# Patient Record
Sex: Female | Born: 1950
Health system: Southern US, Community
[De-identification: ages and names within clinical notes are randomized; demographics above are authoritative.]

## PROBLEM LIST (undated history)

## (undated) DIAGNOSIS — M797 Fibromyalgia: Secondary | ICD-10-CM

## (undated) DIAGNOSIS — E785 Hyperlipidemia, unspecified: Secondary | ICD-10-CM

## (undated) DIAGNOSIS — C801 Malignant (primary) neoplasm, unspecified: Secondary | ICD-10-CM

## (undated) DIAGNOSIS — T4145XA Adverse effect of unspecified anesthetic, initial encounter: Secondary | ICD-10-CM

## (undated) DIAGNOSIS — T3995XA Adverse effect of unspecified nonopioid analgesic, antipyretic and antirheumatic, initial encounter: Secondary | ICD-10-CM

## (undated) DIAGNOSIS — Z8489 Family history of other specified conditions: Secondary | ICD-10-CM

## (undated) DIAGNOSIS — T8859XA Other complications of anesthesia, initial encounter: Secondary | ICD-10-CM

## (undated) DIAGNOSIS — R112 Nausea with vomiting, unspecified: Secondary | ICD-10-CM

## (undated) DIAGNOSIS — Z9889 Other specified postprocedural states: Secondary | ICD-10-CM

## (undated) DIAGNOSIS — K219 Gastro-esophageal reflux disease without esophagitis: Secondary | ICD-10-CM

## (undated) HISTORY — PX: COLONOSCOPY: SHX174

## (undated) HISTORY — DX: Hyperlipidemia, unspecified: E78.5

---

## 1983-09-21 HISTORY — PX: DILATION AND CURETTAGE OF UTERUS: SHX78

## 1999-10-29 ENCOUNTER — Other Ambulatory Visit: Admission: RE | Admit: 1999-10-29 | Discharge: 1999-10-29 | Payer: Self-pay | Admitting: Gynecology

## 1999-11-05 ENCOUNTER — Other Ambulatory Visit: Admission: RE | Admit: 1999-11-05 | Discharge: 1999-11-05 | Payer: Self-pay | Admitting: General Surgery

## 2001-03-02 ENCOUNTER — Other Ambulatory Visit: Admission: RE | Admit: 2001-03-02 | Discharge: 2001-03-02 | Payer: Self-pay | Admitting: Gynecology

## 2001-03-03 ENCOUNTER — Encounter: Payer: Self-pay | Admitting: Family Medicine

## 2001-03-03 ENCOUNTER — Encounter: Admission: RE | Admit: 2001-03-03 | Discharge: 2001-03-03 | Payer: Self-pay | Admitting: Family Medicine

## 2002-04-24 ENCOUNTER — Encounter: Admission: RE | Admit: 2002-04-24 | Discharge: 2002-04-24 | Payer: Self-pay | Admitting: Family Medicine

## 2002-04-24 ENCOUNTER — Encounter: Payer: Self-pay | Admitting: Family Medicine

## 2003-02-05 ENCOUNTER — Other Ambulatory Visit: Admission: RE | Admit: 2003-02-05 | Discharge: 2003-02-05 | Payer: Self-pay | Admitting: Gynecology

## 2004-08-24 ENCOUNTER — Other Ambulatory Visit: Admission: RE | Admit: 2004-08-24 | Discharge: 2004-08-24 | Payer: Self-pay | Admitting: Gynecology

## 2004-10-09 ENCOUNTER — Ambulatory Visit: Payer: Self-pay | Admitting: Gastroenterology

## 2004-11-05 ENCOUNTER — Ambulatory Visit: Payer: Self-pay | Admitting: Gastroenterology

## 2004-11-16 ENCOUNTER — Ambulatory Visit: Payer: Self-pay | Admitting: Gastroenterology

## 2005-09-07 ENCOUNTER — Other Ambulatory Visit: Admission: RE | Admit: 2005-09-07 | Discharge: 2005-09-07 | Payer: Self-pay | Admitting: Gynecology

## 2009-09-26 ENCOUNTER — Encounter (INDEPENDENT_AMBULATORY_CARE_PROVIDER_SITE_OTHER): Payer: Self-pay | Admitting: *Deleted

## 2010-05-19 ENCOUNTER — Telehealth: Payer: Self-pay | Admitting: Gastroenterology

## 2010-10-08 ENCOUNTER — Other Ambulatory Visit: Payer: Self-pay

## 2010-10-22 NOTE — Letter (Signed)
Summary: Colonoscopy Letter  [facility]  [address] [place], [place] [address]   Phone: [phone]  Fax: [phone]      September 26, 2009 MRN: [id number]   [facility] [address] [place], [place]  [address]   Dear Ms. Suminski,   According to your medical record, it is time for you to schedule a Colonoscopy. The American Cancer Society recommends this procedure as a method to detect early colon cancer. Patients with a family history of colon cancer, or a personal history of colon polyps or inflammatory bowel disease are at increased risk.  This letter has beeen generated based on the recommendations made at the time of your procedure. If you feel that in your particular situation this may no longer apply, please contact our office.  Please call our office at [phone] to schedule this appointment or to update your records at your earliest convenience.  Thank you for cooperating with [place] to provide you with the very best care possible.   Sincerely,  Vania Rea. Jarold Motto, M.D.  [facility] [phone]

## 2010-10-22 NOTE — Progress Notes (Signed)
Summary: Schedule Colonoscopy  Phone Note Outgoing Call Call back at Home Phone [phone]   Call placed by: Harlow Mares CMA [facility]),  May 19, 2010 8:50 AM Call placed to: Patient Summary of Call: called patient to advise her she is due for her colonoscopy she will call back to schedule something for October. Initial call taken by: Harlow Mares CMA [facility]),  May 19, 2010 8:50 AM

## 2010-11-26 ENCOUNTER — Other Ambulatory Visit: Payer: Self-pay | Admitting: Gynecology

## 2011-04-08 ENCOUNTER — Other Ambulatory Visit: Payer: Self-pay | Admitting: Gynecology

## 2012-04-14 ENCOUNTER — Encounter: Payer: Self-pay | Admitting: Gastroenterology

## 2012-05-10 ENCOUNTER — Ambulatory Visit (AMBULATORY_SURGERY_CENTER): Payer: BC Managed Care – PPO | Admitting: *Deleted

## 2012-05-10 ENCOUNTER — Encounter: Payer: Self-pay | Admitting: Gastroenterology

## 2012-05-10 VITALS — Ht 66.0 in | Wt 186.8 lb

## 2012-05-10 DIAGNOSIS — Z1211 Encounter for screening for malignant neoplasm of colon: Secondary | ICD-10-CM

## 2012-05-10 MED ORDER — MOVIPREP 100 G PO SOLR: ORAL | Status: DC

## 2012-06-02 ENCOUNTER — Ambulatory Visit (AMBULATORY_SURGERY_CENTER): Payer: BC Managed Care – PPO | Admitting: Gastroenterology

## 2012-06-02 ENCOUNTER — Encounter: Payer: Self-pay | Admitting: Gastroenterology

## 2012-06-02 VITALS — BP 152/86 | HR 98 | Temp 98.2°F | Resp 22 | Ht 66.0 in | Wt 186.0 lb

## 2012-06-02 DIAGNOSIS — Z1211 Encounter for screening for malignant neoplasm of colon: Secondary | ICD-10-CM

## 2012-06-02 DIAGNOSIS — K5730 Diverticulosis of large intestine without perforation or abscess without bleeding: Secondary | ICD-10-CM

## 2012-06-02 MED ORDER — SODIUM CHLORIDE 0.9 % IV SOLN: 500.0000 mL | INTRAVENOUS | Status: DC

## 2012-06-02 NOTE — Patient Instructions (Addendum)
YOU HAD AN ENDOSCOPIC PROCEDURE TODAY AT THE [facility]: Refer to the procedure report that was given to you for any specific questions about what was found during the examination.  If the procedure report does not answer your questions, please call your gastroenterologist to clarify.  If you requested that your care partner not be given the details of your procedure findings, then the procedure report has been included in a sealed envelope for you to review at your convenience later.  YOU SHOULD EXPECT: Some feelings of bloating in the abdomen. Passage of more gas than usual.  Walking can help get rid of the air that was put into your GI tract during the procedure and reduce the bloating. If you had a lower endoscopy (such as a colonoscopy or flexible sigmoidoscopy) you may notice spotting of blood in your stool or on the toilet paper. If you underwent a bowel prep for your procedure, then you may not have a normal bowel movement for a few days.  DIET: Your first meal following the procedure should be a light meal and then it is ok to progress to your normal diet.  A half-sandwich or bowl of soup is an example of a good first meal.  Heavy or fried foods are harder to digest and may make you feel nauseous or bloated.  Likewise meals heavy in dairy and vegetables can cause extra gas to form and this can also increase the bloating.  Drink plenty of fluids but you should avoid alcoholic beverages for 24 hours.  ACTIVITY: Your care partner should take you home directly after the procedure.  You should plan to take it easy, moving slowly for the rest of the day.  You can resume normal activity the day after the procedure however you should NOT DRIVE or use heavy machinery for 24 hours (because of the sedation medicines used during the test).    SYMPTOMS TO REPORT IMMEDIATELY: A gastroenterologist can be reached at any hour.  During normal business hours, 8:30 AM to 5:00 PM Monday through Friday,  call [phone].  After hours and on weekends, please call the GI answering service at [phone] who will take a message and have the physician on call contact you.   Following lower endoscopy (colonoscopy or flexible sigmoidoscopy):  Excessive amounts of blood in the stool  Significant tenderness or worsening of abdominal pains  Swelling of the abdomen that is new, acute  Fever of 100F or higher FOLLOW UP: Our staff will call the home number listed on your records the next business day following your procedure to check on you and address any questions or concerns that you may have at that time regarding the information given to you following your procedure. This is a courtesy call and so if there is no answer at the home number and we have not heard from you through the emergency physician on call, we will assume that you have returned to your regular daily activities without incident.  SIGNATURES/CONFIDENTIALITY: You and/or your care partner have signed paperwork which will be entered into your electronic medical record.  These signatures attest to the fact that that the information above on your After Visit Summary has been reviewed and is understood.  Full responsibility of the confidentiality of this discharge information lies with you and/or your care-partner.   Ok to resume your normal medications  Follow up in 10 years  Follow a high fiber diet- see handout 

## 2012-06-02 NOTE — Progress Notes (Signed)
Patient did not experience any of the following events: a burn prior to discharge; a fall within the facility; wrong site/side/patient/procedure/implant event; or a hospital transfer or hospital admission upon discharge from the facility. (G8907) Patient did not have preoperative order for IV antibiotic SSI prophylaxis. (G8918)  

## 2012-06-02 NOTE — Op Note (Signed)
 [facility] [address] [place], [address]   COLONOSCOPY PROCEDURE REPORT  PATIENT: Janice Phillips, Janice Phillips.  MR#: [id number] BIRTHDATE: 11-14-1950 , 61  yrs. old GENDER: Female ENDOSCOPIST: Mardella Layman, MD, [facility] REFERRED BY: PROCEDURE DATE:  06/02/2012 PROCEDURE:   Colonoscopy, screening ASA CLASS:   Class II INDICATIONS:average risk patient for colon cancer. MEDICATIONS: propofol (Diprivan) 200mg  IV  DESCRIPTION OF PROCEDURE:   After the risks and benefits and of the procedure were explained, informed consent was obtained.  A digital rectal exam revealed no abnormalities of the rectum.    The LB [id number]  endoscope was introduced through the anus and advanced to the cecum, which was identified by both the appendix and ileocecal valve .  The quality of the prep was excellent, using MoviPrep .  The instrument was then slowly withdrawn as the colon was fully examined.     COLON FINDINGS: Mild diverticulosis was noted in the sigmoid colon. The colon mucosa was otherwise normal.     Retroflexed views revealed no abnormalities.     The scope was then withdrawn from the patient and the procedure completed.  COMPLICATIONS: There were no complications. ENDOSCOPIC IMPRESSION: 1.   Mild diverticulosis was noted in the sigmoid colon 2.   The colon mucosa was otherwise normal  RECOMMENDATIONS: 1.  High fiber diet 2.  Continue current colorectal screening recommendations for "routine risk" patients with a repeat colonoscopy in 10 years.   REPEAT EXAM:  cc:  _______________________________ eSignedMardella Layman, MD, [facility] 06/02/2012 1:50 PM

## 2012-06-05 ENCOUNTER — Telehealth: Payer: Self-pay

## 2012-06-05 NOTE — Telephone Encounter (Signed)
  Follow up Call-  Call back number 06/02/2012  Post procedure Call Back phone  # [phone]  Permission to leave phone message Yes     Patient questions:  Do you have a fever, pain , or abdominal swelling? no Pain Score  0 *  Have you tolerated food without any problems? yes  Have you been able to return to your normal activities? yes  Do you have any questions about your discharge instructions: Diet   no Medications  no Follow up visit  no  Do you have questions or concerns about your Care? no  Actions: * If pain score is 4 or above: No action needed, pain <4.

## 2012-08-31 ENCOUNTER — Other Ambulatory Visit: Payer: Self-pay

## 2013-02-22 ENCOUNTER — Other Ambulatory Visit: Payer: Self-pay | Admitting: Gynecology

## 2013-09-20 HISTORY — PX: KNEE ARTHROSCOPY: SUR90

## 2014-02-28 ENCOUNTER — Other Ambulatory Visit: Payer: Self-pay | Admitting: Gynecology

## 2014-03-01 LAB — CYTOLOGY - PAP

## 2015-10-30 ENCOUNTER — Other Ambulatory Visit: Payer: Self-pay | Admitting: Obstetrics and Gynecology

## 2015-10-31 LAB — CYTOLOGY - PAP

## 2016-01-13 ENCOUNTER — Other Ambulatory Visit: Payer: Self-pay | Admitting: Obstetrics and Gynecology

## 2016-01-13 DIAGNOSIS — N8500 Endometrial hyperplasia, unspecified: Secondary | ICD-10-CM | POA: Diagnosis not present

## 2016-01-13 DIAGNOSIS — C541 Malignant neoplasm of endometrium: Secondary | ICD-10-CM | POA: Diagnosis not present

## 2016-01-13 DIAGNOSIS — N950 Postmenopausal bleeding: Secondary | ICD-10-CM | POA: Diagnosis not present

## 2016-02-02 ENCOUNTER — Ambulatory Visit: Payer: BLUE CROSS/BLUE SHIELD | Attending: Gynecologic Oncology | Admitting: Gynecologic Oncology

## 2016-02-02 ENCOUNTER — Encounter: Payer: Self-pay | Admitting: Gynecologic Oncology

## 2016-02-02 VITALS — BP 160/83 | HR 102 | Temp 98.3°F | Resp 18 | Ht 66.0 in | Wt 181.3 lb

## 2016-02-02 DIAGNOSIS — E785 Hyperlipidemia, unspecified: Secondary | ICD-10-CM | POA: Diagnosis not present

## 2016-02-02 DIAGNOSIS — C541 Malignant neoplasm of endometrium: Secondary | ICD-10-CM | POA: Diagnosis not present

## 2016-02-02 DIAGNOSIS — Z885 Allergy status to narcotic agent status: Secondary | ICD-10-CM | POA: Insufficient documentation

## 2016-02-02 DIAGNOSIS — R112 Nausea with vomiting, unspecified: Secondary | ICD-10-CM | POA: Insufficient documentation

## 2016-02-02 DIAGNOSIS — Z79899 Other long term (current) drug therapy: Secondary | ICD-10-CM | POA: Insufficient documentation

## 2016-02-02 NOTE — Patient Instructions (Signed)
Preparing for your Surgery  Plan for surgery on May 30 with Dr. Everitt Amber at [facility] will be scheduled for a robotic assisted total hysterectomy, bilateral salpingo-oophorectomy, sentinel lymph node biopsy.  Risks of surgery include bleeding, damage to surrounding structures, re-operation, infection.  Pre-operative Testing -You will receive a phone call from presurgical testing at [facility] to arrange for a pre-operative testing appointment before your surgery.  This appointment normally occurs one to two weeks before your scheduled surgery.   -Bring your insurance card, copy of an advanced directive if applicable, medication list  -At that visit, you will be asked to sign a consent for a possible blood transfusion in case a transfusion becomes necessary during surgery.  The need for a blood transfusion is rare but having consent is a necessary part of your care.     -You should not be taking blood thinners or aspirin at least ten days prior to surgery unless instructed by your surgeon.  Day Before Surgery at [place] will be asked to take in a light diet the day before surgery.  Avoid carbonated beverages.  You will be advised to have nothing to eat or drink after midnight the evening before.     Eat a light diet the day before surgery.  Examples including soups, broths, toast, yogurt, mashed potatoes.  Things to avoid include carbonated beverages (fizzy beverages), raw fruits and raw vegetables, or beans.    If your bowels are filled with gas, your surgeon will have difficulty visualizing your pelvic organs which increases your surgical risks.  Your role in recovery Your role is to become active as soon as directed by your doctor, while still giving yourself time to heal.  Rest when you feel tired. You will be asked to do the following in order to speed your recovery:  - Cough and breathe deeply. This helps toclear and expand your lungs and can  prevent pneumonia. You may be given a spirometer to practice deep breathing. A staff member will show you how to use the spirometer. - Do mild physical activity. Walking or moving your legs help your circulation and body functions return to normal. A staff member will help you when you try to walk and will provide you with simple exercises. Do not try to get up or walk alone the first time. - Actively manage your pain. Managing your pain lets you move in comfort. We will ask you to rate your pain on a scale of zero to 10. It is your responsibility to tell your doctor or nurse where and how much you hurt so your pain can be treated.  Special Considerations -If you are diabetic, you may be placed on insulin after surgery to have closer control over your blood sugars to promote healing and recovery.  This does not mean that you will be discharged on insulin.  If applicable, your oral antidiabetics will be resumed when you are tolerating a solid diet.  -Your final pathology results from surgery should be available by the Friday after surgery and the results will be relayed to you when available.  Blood Transfusion Information WHAT IS A BLOOD TRANSFUSION? A transfusion is the replacement of blood or some of its parts. Blood is made up of multiple cells which provide different functions.  Red blood cells carry oxygen and are used for blood loss replacement.  White blood cells fight against infection.  Platelets control bleeding.  Plasma helps clot blood.  Other  blood products are available for specialized needs, such as hemophilia or other clotting disorders. BEFORE THE TRANSFUSION  Who gives blood for transfusions?   You may be able to donate blood to be used at a later date on yourself (autologous donation).  Relatives can be asked to donate blood. This is generally not any safer than if you have received blood from a stranger. The same precautions are taken to ensure safety when a relative's  blood is donated.  Healthy volunteers who are fully evaluated to make sure their blood is safe. This is blood bank blood. Transfusion therapy is the safest it has ever been in the practice of medicine. Before blood is taken from a donor, a complete history is taken to make sure that person has no history of diseases nor engages in risky social behavior (examples are intravenous drug use or sexual activity with multiple partners). The donor's travel history is screened to minimize risk of transmitting infections, such as malaria. The donated blood is tested for signs of infectious diseases, such as HIV and hepatitis. The blood is then tested to be sure it is compatible with you in order to minimize the chance of a transfusion reaction. If you or a relative donates blood, this is often done in anticipation of surgery and is not appropriate for emergency situations. It takes many days to process the donated blood. RISKS AND COMPLICATIONS Although transfusion therapy is very safe and saves many lives, the main dangers of transfusion include:   Getting an infectious disease.  Developing a transfusion reaction. This is an allergic reaction to something in the blood you were given. Every precaution is taken to prevent this. The decision to have a blood transfusion has been considered carefully by your caregiver before blood is given. Blood is not given unless the benefits outweigh the risks.

## 2016-02-03 ENCOUNTER — Encounter: Payer: Self-pay | Admitting: Gynecologic Oncology

## 2016-02-03 DIAGNOSIS — C541 Malignant neoplasm of endometrium: Secondary | ICD-10-CM | POA: Insufficient documentation

## 2016-02-03 NOTE — Progress Notes (Signed)
Consult Note: Gyn-Onc  Consult was requested by Dr. Philis Pique for the evaluation of Janice Phillips 65 y.o. female  CC:  Chief Complaint  Patient presents with  . New Consultation    endometrial cancer    Assessment/Plan:  Janice Phillips  is a 65 y.o.  year old with grade 1 endometrial adenocarcinoma on D&C.   A detailed discussion was held with the patient and her family with regard to to her endometrial cancer diagnosis. We discussed the standard management options for uterine cancer which includes surgery followed possibly by adjuvant therapy depending on the results of surgery. The options for surgical management include a hysterectomy and removal of the tubes and ovaries possibly with removal of pelvic and para-aortic lymph nodes. A minimally invasive approach including a robotic hysterectomy or laparoscopic hysterectomy have benefits including shorter hospital stay, recovery time and better wound healing. The alternative approach is an open hysterectomy. The patient has been counseled about these surgical options and the risks of surgery in general including infection, bleeding, damage to surrounding structures (including bowel, bladder, ureters, nerves or vessels), and the postoperative risks of PE/ DVT, and lymphedema. I extensively reviewed the additional risks of robotic hysterectomy including possible need for conversion to open laparotomy.  I discussed positioning during surgery of trendelenberg and risks of minor facial swelling and care we take in preoperative positioning.  After counseling and consideration of her options, she desires to proceed with robotic assisted total laparoscopic hysterectomy, BSO, sentinel lymph node biopsy.   She will be seen by anesthesia for preoperative clearance and discussion of postoperative pain management.  She was given the opportunity to ask questions, which were answered to her satisfaction, and she is agreement with the above mentioned plan of  care. She experiences severe post op nausea and vomiting and we will discuss this further with anesthesia at the time of her surgery to prophylax.   HPI: Janice Phillips is a 65 year old parous woman who is seen in consultation at the request of Dr Philis Pique for grade 1 endometrial cancer. She has been having intermittent postmenopausal bleeding for approximately 5 years with several office biopsies that had always been benign. Most recently seh reported this symptom to Dr Philis Pique, who, on 01/13/16 took her to the operating room for a hysteroscopy and D&C. Intraoperative findings included uterine polyps. Final pathology revealed adenocarcinoma FIGO grade 1.   The patient is otherwise very healthy and has had no abdominal surgeries. She has severe postoperative nausea and vomiting and is very "sensitive" to anesthesia.  She is easily oversedated.  Her BMI is overweight at 29kg/m2.   Current Meds:  Outpatient Encounter Prescriptions as of 02/02/2016  Medication Sig  . calcium gluconate 500 MG tablet Take 500 mg by mouth daily.  . cholecalciferol (VITAMIN D) 1000 UNITS tablet Take 1,000 Units by mouth daily.  . Cyanocobalamin (VITAMIN B-12 PO) Take 1 tablet by mouth daily.   . meclizine (ANTIVERT) 25 MG tablet Take 25 mg by mouth as needed (For vertigo.).   . [DISCONTINUED] CRESTOR 10 MG tablet Take 5 mg by mouth daily. Takes 2.5mg  daily  . [DISCONTINUED] Misc Natural Products (SUPER GREENS) POWD Take by mouth. Takes 2 scoops daily   No facility-administered encounter medications on file as of 02/02/2016.    Allergy:  Allergies  Allergen Reactions  . Vicodin [Hydrocodone-Acetaminophen] Nausea And Vomiting    Social Hx:   Social History   Social History  . Marital Status: Married  Spouse Name: N/A  . Number of Children: N/A  . Years of Education: N/A   Occupational History  . Not on file.   Social History Main Topics  . Smoking status: Never Smoker   . Smokeless tobacco: Never Used  .  Alcohol Use: No  . Drug Use: No  . Sexual Activity: Not on file   Other Topics Concern  . Not on file   Social History Narrative    Past Surgical Hx:  Past Surgical History  Procedure Laterality Date  . Dilation and curettage of uterus  1985    Past Medical Hx:  Past Medical History  Diagnosis Date  . Hyperlipidemia     Past Gynecological History:  SVD x 2  No LMP recorded. Patient is postmenopausal.  Family Hx:  Family History  Problem Relation Age of Onset  . Breast cancer Sister   . Colon cancer Maternal Grandfather 97  . Stomach cancer Neg Hx     Review of Systems:  Constitutional  Feels well,    ENT Normal appearing ears and nares bilaterally Skin/Breast  No rash, sores, jaundice, itching, dryness Cardiovascular  No chest pain, shortness of breath, or edema  Pulmonary  No cough or wheeze.  Gastro Intestinal  No nausea, vomitting, or diarrhoea. No bright red blood per rectum, no abdominal pain, change in bowel movement, or constipation.  Genito Urinary  No frequency, urgency, dysuria, + PMB Musculo Skeletal  No myalgia, arthralgia, joint swelling or pain  Neurologic  No weakness, numbness, change in gait,  Psychology  No depression, anxiety, insomnia.   Vitals:  Blood pressure 160/83, pulse 102, temperature 98.3 F (36.8 C), temperature source Oral, resp. rate 18, height 5\' 6"  (1.676 m), weight 181 lb 4.8 oz (82.237 kg), SpO2 99 %.  Physical Exam: WD in NAD Neck  Supple NROM, without any enlargements.  Lymph Node Survey No cervical supraclavicular or inguinal adenopathy Cardiovascular  Pulse normal rate, regularity and rhythm. S1 and S2 normal.  Lungs  Clear to auscultation bilateraly, without wheezes/crackles/rhonchi. Good air movement.  Skin  No rash/lesions/breakdown  Psychiatry  Alert and oriented to person, place, and time  Abdomen  Normoactive bowel sounds, abdomen soft, non-tender and slightly overweight without evidence of hernia.   Back No CVA tenderness Genito Urinary  Vulva/vagina: Normal external female genitalia.  No lesions. No discharge or bleeding.  Bladder/urethra:  No lesions or masses, well supported bladder  Vagina: normal  Cervix: Normal appearing, no lesions.  Uterus:  Birenbaum, mobile, no parametrial involvement or nodularity.  Adnexa: no palpable masses. Rectal  Good tone, no masses no cul de sac nodularity.  Extremities  No bilateral cyanosis, clubbing or edema.   Donaciano Eva, MD  02/03/2016, 4:41 PM

## 2016-02-05 NOTE — Progress Notes (Signed)
Please place surgical orders in epic for surgical procedure scheduled for 02/17/2016. Pt has PAT appt scheduled for 02/12/2016. Thanks.

## 2016-02-12 ENCOUNTER — Ambulatory Visit (HOSPITAL_COMMUNITY)
Admission: RE | Admit: 2016-02-12 | Discharge: 2016-02-12 | Disposition: A | Discharge status: Home / Self Care | Payer: BLUE CROSS/BLUE SHIELD | Source: Ambulatory Visit | Attending: Gynecologic Oncology | Admitting: Gynecologic Oncology

## 2016-02-12 ENCOUNTER — Encounter (HOSPITAL_COMMUNITY)
Admission: RE | Admit: 2016-02-12 | Discharge: 2016-02-12 | Disposition: A | Discharge status: Home / Self Care | Payer: BLUE CROSS/BLUE SHIELD | Source: Ambulatory Visit | Attending: Gynecologic Oncology | Admitting: Gynecologic Oncology

## 2016-02-12 ENCOUNTER — Encounter (HOSPITAL_COMMUNITY): Payer: Self-pay

## 2016-02-12 DIAGNOSIS — Z01818 Encounter for other preprocedural examination: Secondary | ICD-10-CM | POA: Insufficient documentation

## 2016-02-12 DIAGNOSIS — C541 Malignant neoplasm of endometrium: Secondary | ICD-10-CM | POA: Insufficient documentation

## 2016-02-12 HISTORY — DX: Adverse effect of unspecified nonopioid analgesic, antipyretic and antirheumatic, initial encounter: T39.95XA

## 2016-02-12 HISTORY — DX: Family history of other specified conditions: Z84.89

## 2016-02-12 HISTORY — DX: Adverse effect of unspecified anesthetic, initial encounter: T41.45XA

## 2016-02-12 HISTORY — DX: Other specified postprocedural states: Z98.890

## 2016-02-12 HISTORY — DX: Malignant (primary) neoplasm, unspecified: C80.1

## 2016-02-12 HISTORY — DX: Fibromyalgia: M79.7

## 2016-02-12 HISTORY — DX: Nausea with vomiting, unspecified: R11.2

## 2016-02-12 HISTORY — DX: Other complications of anesthesia, initial encounter: T88.59XA

## 2016-02-12 HISTORY — DX: Gastro-esophageal reflux disease without esophagitis: K21.9

## 2016-02-12 LAB — CBC WITH DIFFERENTIAL/PLATELET
Basophils Absolute: 0.1 10*3/uL (ref 0.0–0.1)
Basophils Relative: 1 %
EOS ABS: 0.2 10*3/uL (ref 0.0–0.7)
Eosinophils Relative: 2 %
HCT: 42.8 % (ref 36.0–46.0)
HEMOGLOBIN: 15.2 g/dL — AB (ref 12.0–15.0)
LYMPHS ABS: 2.1 10*3/uL (ref 0.7–4.0)
LYMPHS PCT: 24 %
MCH: 31.6 pg (ref 26.0–34.0)
MCHC: 35.5 g/dL (ref 30.0–36.0)
MCV: 89 fL (ref 78.0–100.0)
Monocytes Absolute: 0.8 10*3/uL (ref 0.1–1.0)
Monocytes Relative: 9 %
NEUTROS PCT: 64 %
Neutro Abs: 5.5 10*3/uL (ref 1.7–7.7)
Platelets: 251 10*3/uL (ref 150–400)
RBC: 4.81 MIL/uL (ref 3.87–5.11)
RDW: 12.8 % (ref 11.5–15.5)
WBC: 8.5 10*3/uL (ref 4.0–10.5)

## 2016-02-12 LAB — URINALYSIS, ROUTINE W REFLEX MICROSCOPIC
Bilirubin Urine: NEGATIVE
Glucose, UA: NEGATIVE mg/dL
Hgb urine dipstick: NEGATIVE
Ketones, ur: NEGATIVE mg/dL
NITRITE: NEGATIVE
PH: 7 (ref 5.0–8.0)
Protein, ur: NEGATIVE mg/dL
SPECIFIC GRAVITY, URINE: 1.005 (ref 1.005–1.030)

## 2016-02-12 LAB — COMPREHENSIVE METABOLIC PANEL
ALK PHOS: 66 U/L (ref 38–126)
ALT: 26 U/L (ref 14–54)
AST: 27 U/L (ref 15–41)
Albumin: 4.6 g/dL (ref 3.5–5.0)
Anion gap: 8 (ref 5–15)
BUN: 13 mg/dL (ref 6–20)
CALCIUM: 10.1 mg/dL (ref 8.9–10.3)
CO2: 27 mmol/L (ref 22–32)
CREATININE: 1 mg/dL (ref 0.44–1.00)
Chloride: 105 mmol/L (ref 101–111)
GFR calc non Af Amer: 59 mL/min — ABNORMAL LOW (ref >60)
GLUCOSE: 94 mg/dL (ref 65–99)
Potassium: 3.8 mmol/L (ref 3.5–5.1)
SODIUM: 140 mmol/L (ref 135–145)
Total Bilirubin: 0.6 mg/dL (ref 0.3–1.2)
Total Protein: 7.6 g/dL (ref 6.5–8.1)

## 2016-02-12 LAB — URINE MICROSCOPIC-ADD ON: RBC / HPF: NONE SEEN RBC/hpf (ref 0–5)

## 2016-02-12 LAB — ABO/RH: ABO/RH(D): O POS

## 2016-02-12 NOTE — Patient Instructions (Signed)
Janice Phillips  02/12/2016   Your procedure is scheduled on: 02/17/16  Report to [facility] Main  Entrance take [facility]  elevators to 3rd floor to  [place] at 7:45 AM.  Call this number if you have problems the morning of surgery [phone]   Remember: ONLY 1 PERSON MAY GO WITH YOU TO SHORT STAY TO GET  READY MORNING OF [place].  Do not eat food or drink liquids :After Midnight Monday.     Take these medicines the morning of surgery with A SIP OF WATER:  none                                You may not have any metal on your body including hair pins and              piercings  Do not wear jewelry, make-up, lotions, powders or perfumes, deodorant             Do not wear nail polish.  Do not shave  48 hours prior to surgery.                 Do not bring valuables to the hospital. [place].  Contacts, dentures or bridgework may not be worn into surgery.  Leave suitcase in the car. After surgery it may be brought to your room.  _____________________________________________________________________             [facility] - Preparing for Surgery  Before surgery, you can play an important role.  Because skin is not sterile, your skin needs to be as free of germs as possible.  You can reduce the number of germs on you skin by washing with CHG (chlorahexidine gluconate) soap before surgery.  CHG is an antiseptic cleaner which kills germs and bonds with the skin to continue killing germs even after washing.  Please DO NOT use if you have an allergy to CHG or antibacterial soaps.  If your skin becomes reddened/irritated stop using the CHG and inform your nurse when you arrive at Short Stay.  Do not shave (including legs and underarms) for at least 48 hours prior to the first CHG shower.  You may shave your face.  Please follow these instructions carefully:   1.  Shower with CHG Soap the night before  surgery and the                                morning of Surgery.  2.  If you choose to wash your hair, wash your hair first as usual with your       normal shampoo.  3.  After you shampoo, rinse your hair and body thoroughly to remove the                      Shampoo.  4.  Use CHG as you would any other liquid soap.  You can apply chg directly       to the skin and wash gently with scrungie or a clean washcloth.  5.  Apply the CHG Soap to your body ONLY FROM THE NECK DOWN.  Do not use on open wounds or open sores.  Avoid contact with your eyes,       ears, mouth and genitals (private parts).  Wash genitals (private parts)       with your normal soap.  Incentive Spirometer  An incentive spirometer is a tool that can help keep your lungs clear and active. This tool measures how well you are filling your lungs with each breath. Taking long deep breaths may help reverse or decrease the chance of developing breathing (pulmonary) problems (especially infection) following:  A long period of time when you are unable to move or be active. BEFORE THE PROCEDURE   If the spirometer includes an indicator to show your best effort, your nurse or respiratory therapist will set it to a desired goal.  If possible, sit up straight or lean slightly forward. Try not to slouch.  Hold the incentive spirometer in an upright position. INSTRUCTIONS FOR USE  1. Sit on the edge of your bed if possible, or sit up as far as you can in bed or on a chair. 2. Hold the incentive spirometer in an upright position. 3. Breathe out normally. 4. Place the mouthpiece in your mouth and seal your lips tightly around it. 5. Breathe in slowly and as deeply as possible, raising the piston or the ball toward the top of the column. 6. Hold your breath for 3-5 seconds or for as long as possible. Allow the piston or ball to fall to the bottom of the column. 7. Remove the mouthpiece from your mouth and breathe out normally. 8. Rest  for a few seconds and repeat Steps 1 through 7 at least 10 times every 1-2 hours when you are awake. Take your time and take a few normal breaths between deep breaths. 9. The spirometer may include an indicator to show your best effort. Use the indicator as a goal to work toward during each repetition. 10. After each set of 10 deep breaths, practice coughing to be sure your lungs are clear. If you have an incision (the cut made at the time of surgery), support your incision when coughing by placing a pillow or rolled up towels firmly against it. Once you are able to get out of bed, walk around indoors and cough well. You may stop using the incentive spirometer when instructed by your caregiver.  RISKS AND COMPLICATIONS  Take your time so you do not get dizzy or light-headed.  If you are in pain, you may need to take or ask for pain medication before doing incentive spirometry. It is harder to take a deep breath if you are having pain. AFTER USE  Rest and breathe slowly and easily.  It can be helpful to keep track of a log of your progress. Your caregiver can provide you with a simple table to help with this. If you are using the spirometer at home, follow these instructions: [place] IF:   You are having difficultly using the spirometer.  You have trouble using the spirometer as often as instructed.  Your pain medication is not giving enough relief while using the spirometer.  You develop fever of 100.5 F (38.1 C) or higher. SEEK IMMEDIATE MEDICAL CARE IF:   You cough up bloody sputum that had not been present before.  You develop fever of 102 F (38.9 C) or greater.  You develop worsening pain at or near the incision site. MAKE SURE YOU:   Understand these instructions.  Will watch  your condition.  Will get help right away if you are not doing well or get worse. Document Released: 01/17/2007 Document Revised: 11/29/2011 Document Reviewed: 03/20/2007 [facility]  Patient Information 2014 [employer], [place].   ________________________________________________________________________   6.  Wash thoroughly, paying special attention to the area where your surgery        will be performed.  7.  Thoroughly rinse your body with warm water from the neck down.  8.  DO NOT shower/wash with your normal soap after using and rinsing off       the CHG Soap.  9.  Pat yourself dry with a clean towel.            10.  Wear clean pajamas.            11.  Place clean sheets on your bed the night of your first shower and do not        sleep with pets.  Day of Surgery  Do not apply any lotions/deoderants the morning of surgery.  Please wear clean clothes to the hospital/surgery center.

## 2016-02-17 ENCOUNTER — Encounter (HOSPITAL_COMMUNITY)
Admission: RE | Disposition: A | Discharge status: Home / Self Care | Payer: Self-pay | Source: Ambulatory Visit | Attending: Gynecologic Oncology

## 2016-02-17 ENCOUNTER — Ambulatory Visit (HOSPITAL_COMMUNITY): Payer: BLUE CROSS/BLUE SHIELD | Admitting: Anesthesiology

## 2016-02-17 ENCOUNTER — Ambulatory Visit (HOSPITAL_COMMUNITY)
Admission: RE | Admit: 2016-02-17 | Discharge: 2016-02-18 | Disposition: A | Discharge status: Home / Self Care | Payer: BLUE CROSS/BLUE SHIELD | Source: Ambulatory Visit | Attending: Gynecologic Oncology | Admitting: Gynecologic Oncology

## 2016-02-17 ENCOUNTER — Encounter (HOSPITAL_COMMUNITY): Payer: Self-pay | Admitting: *Deleted

## 2016-02-17 DIAGNOSIS — E663 Overweight: Secondary | ICD-10-CM | POA: Diagnosis not present

## 2016-02-17 DIAGNOSIS — Z800 Family history of malignant neoplasm of digestive organs: Secondary | ICD-10-CM | POA: Insufficient documentation

## 2016-02-17 DIAGNOSIS — K219 Gastro-esophageal reflux disease without esophagitis: Secondary | ICD-10-CM | POA: Diagnosis not present

## 2016-02-17 DIAGNOSIS — Z885 Allergy status to narcotic agent status: Secondary | ICD-10-CM | POA: Diagnosis not present

## 2016-02-17 DIAGNOSIS — Z6829 Body mass index (BMI) 29.0-29.9, adult: Secondary | ICD-10-CM | POA: Diagnosis not present

## 2016-02-17 DIAGNOSIS — C541 Malignant neoplasm of endometrium: Secondary | ICD-10-CM | POA: Diagnosis present

## 2016-02-17 DIAGNOSIS — E785 Hyperlipidemia, unspecified: Secondary | ICD-10-CM | POA: Insufficient documentation

## 2016-02-17 DIAGNOSIS — D251 Intramural leiomyoma of uterus: Secondary | ICD-10-CM | POA: Diagnosis not present

## 2016-02-17 DIAGNOSIS — M797 Fibromyalgia: Secondary | ICD-10-CM | POA: Insufficient documentation

## 2016-02-17 DIAGNOSIS — Z803 Family history of malignant neoplasm of breast: Secondary | ICD-10-CM | POA: Diagnosis not present

## 2016-02-17 HISTORY — PX: ROBOTIC ASSISTED TOTAL HYSTERECTOMY WITH BILATERAL SALPINGO OOPHERECTOMY: SHX6086

## 2016-02-17 LAB — TYPE AND SCREEN
ABO/RH(D): O POS
ANTIBODY SCREEN: NEGATIVE

## 2016-02-17 SURGERY — HYSTERECTOMY, TOTAL, ROBOT-ASSISTED, LAPAROSCOPIC, WITH BILATERAL SALPINGO-OOPHORECTOMY
Anesthesia: General | Laterality: Bilateral

## 2016-02-17 MED ORDER — IBUPROFEN 800 MG PO TABS: 800.0000 mg | ORAL_TABLET | Freq: Three times a day (TID) | ORAL | Status: DC | PRN

## 2016-02-17 MED ORDER — PROPOFOL 10 MG/ML IV BOLUS
INTRAVENOUS | Status: DC | PRN
  Administered 2016-02-17: 200 mg via INTRAVENOUS

## 2016-02-17 MED ORDER — ONDANSETRON HCL 4 MG/2ML IJ SOLN
INTRAMUSCULAR | Status: DC | PRN
  Administered 2016-02-17: 4 mg via INTRAVENOUS

## 2016-02-17 MED ORDER — ACETAMINOPHEN 10 MG/ML IV SOLN
INTRAVENOUS | Status: AC
  Filled 2016-02-17: qty 100

## 2016-02-17 MED ORDER — KETOROLAC TROMETHAMINE 15 MG/ML IJ SOLN
15.0000 mg | Freq: Four times a day (QID) | INTRAMUSCULAR | Status: DC
  Administered 2016-02-17 – 2016-02-18 (×3): 15 mg via INTRAVENOUS
  Filled 2016-02-17 (×4): qty 1

## 2016-02-17 MED ORDER — ONDANSETRON HCL 4 MG/2ML IJ SOLN
4.0000 mg | Freq: Four times a day (QID) | INTRAMUSCULAR | Status: DC | PRN
  Administered 2016-02-17: 4 mg via INTRAVENOUS
  Filled 2016-02-17: qty 2

## 2016-02-17 MED ORDER — DEXAMETHASONE SODIUM PHOSPHATE 10 MG/ML IJ SOLN
INTRAMUSCULAR | Status: AC
  Filled 2016-02-17: qty 1

## 2016-02-17 MED ORDER — SUCCINYLCHOLINE CHLORIDE 20 MG/ML IJ SOLN
INTRAMUSCULAR | Status: DC | PRN
  Administered 2016-02-17: 100 mg via INTRAVENOUS

## 2016-02-17 MED ORDER — CEFAZOLIN SODIUM-DEXTROSE 2-4 GM/100ML-% IV SOLN
2.0000 g | INTRAVENOUS | Status: AC
  Administered 2016-02-17: 2 g via INTRAVENOUS
  Filled 2016-02-17: qty 100

## 2016-02-17 MED ORDER — EPHEDRINE SULFATE 50 MG/ML IJ SOLN
INTRAMUSCULAR | Status: DC | PRN
  Administered 2016-02-17: 5 mg via INTRAVENOUS

## 2016-02-17 MED ORDER — CETYLPYRIDINIUM CHLORIDE 0.05 % MT LIQD
7.0000 mL | Freq: Two times a day (BID) | OROMUCOSAL | Status: DC
  Administered 2016-02-17 – 2016-02-18 (×2): 7 mL via OROMUCOSAL

## 2016-02-17 MED ORDER — KETOROLAC TROMETHAMINE 30 MG/ML IJ SOLN
INTRAMUSCULAR | Status: DC | PRN
  Administered 2016-02-17: 30 mg via INTRAVENOUS

## 2016-02-17 MED ORDER — SUGAMMADEX SODIUM 200 MG/2ML IV SOLN
INTRAVENOUS | Status: AC
  Filled 2016-02-17: qty 2

## 2016-02-17 MED ORDER — KCL IN DEXTROSE-NACL 20-5-0.45 MEQ/L-%-% IV SOLN
INTRAVENOUS | Status: DC
  Administered 2016-02-17: 15:00:00 via INTRAVENOUS
  Administered 2016-02-18: 1000 mL via INTRAVENOUS
  Filled 2016-02-17 (×3): qty 1000

## 2016-02-17 MED ORDER — MIDAZOLAM HCL 2 MG/2ML IJ SOLN
INTRAMUSCULAR | Status: AC
  Filled 2016-02-17: qty 2

## 2016-02-17 MED ORDER — ACETAMINOPHEN 10 MG/ML IV SOLN
1000.0000 mg | Freq: Once | INTRAVENOUS | Status: AC
  Administered 2016-02-17: 1000 mg via INTRAVENOUS

## 2016-02-17 MED ORDER — PROCHLORPERAZINE EDISYLATE 5 MG/ML IJ SOLN
INTRAMUSCULAR | Status: AC
  Filled 2016-02-17: qty 2

## 2016-02-17 MED ORDER — PROCHLORPERAZINE EDISYLATE 5 MG/ML IJ SOLN
10.0000 mg | Freq: Once | INTRAMUSCULAR | Status: AC | PRN
  Administered 2016-02-17: 10 mg via INTRAVENOUS

## 2016-02-17 MED ORDER — BUPIVACAINE HCL (PF) 0.25 % IJ SOLN
INTRAMUSCULAR | Status: DC | PRN
  Administered 2016-02-17: 20 mL

## 2016-02-17 MED ORDER — KETOROLAC TROMETHAMINE 30 MG/ML IJ SOLN
INTRAMUSCULAR | Status: AC
  Filled 2016-02-17: qty 1

## 2016-02-17 MED ORDER — ROCURONIUM BROMIDE 100 MG/10ML IV SOLN
INTRAVENOUS | Status: AC
  Filled 2016-02-17: qty 1

## 2016-02-17 MED ORDER — ENOXAPARIN SODIUM 40 MG/0.4ML ~~LOC~~ SOLN
40.0000 mg | SUBCUTANEOUS | Status: AC
  Administered 2016-02-17: 40 mg via SUBCUTANEOUS
  Filled 2016-02-17: qty 0.4

## 2016-02-17 MED ORDER — FENTANYL CITRATE (PF) 250 MCG/5ML IJ SOLN
INTRAMUSCULAR | Status: AC
  Filled 2016-02-17: qty 5

## 2016-02-17 MED ORDER — ONDANSETRON HCL 4 MG/2ML IJ SOLN
INTRAMUSCULAR | Status: AC
  Filled 2016-02-17: qty 2

## 2016-02-17 MED ORDER — ACETAMINOPHEN 500 MG PO TABS
1000.0000 mg | ORAL_TABLET | Freq: Four times a day (QID) | ORAL | Status: DC
  Administered 2016-02-17 – 2016-02-18 (×3): 1000 mg via ORAL
  Filled 2016-02-17 (×7): qty 2

## 2016-02-17 MED ORDER — STERILE WATER FOR IRRIGATION IR SOLN
Status: DC | PRN
  Administered 2016-02-17: 1000 mL

## 2016-02-17 MED ORDER — ONDANSETRON HCL 4 MG PO TABS: 4.0000 mg | ORAL_TABLET | Freq: Four times a day (QID) | ORAL | Status: DC | PRN

## 2016-02-17 MED ORDER — STERILE WATER FOR INJECTION IJ SOLN
INTRAMUSCULAR | Status: AC
  Filled 2016-02-17: qty 10

## 2016-02-17 MED ORDER — HYDROMORPHONE HCL 1 MG/ML IJ SOLN
INTRAMUSCULAR | Status: AC
  Filled 2016-02-17: qty 1

## 2016-02-17 MED ORDER — HYDROMORPHONE HCL 1 MG/ML IJ SOLN
0.2500 mg | INTRAMUSCULAR | Status: DC | PRN
  Administered 2016-02-17 (×2): 0.5 mg via INTRAVENOUS

## 2016-02-17 MED ORDER — ENOXAPARIN SODIUM 40 MG/0.4ML ~~LOC~~ SOLN
40.0000 mg | SUBCUTANEOUS | Status: DC
  Administered 2016-02-18: 40 mg via SUBCUTANEOUS
  Filled 2016-02-17 (×2): qty 0.4

## 2016-02-17 MED ORDER — KETOROLAC TROMETHAMINE 15 MG/ML IJ SOLN: 15.0000 mg | Freq: Four times a day (QID) | INTRAMUSCULAR | Status: DC

## 2016-02-17 MED ORDER — ROCURONIUM BROMIDE 100 MG/10ML IV SOLN
INTRAVENOUS | Status: DC | PRN
  Administered 2016-02-17 (×2): 10 mg via INTRAVENOUS
  Administered 2016-02-17: 40 mg via INTRAVENOUS

## 2016-02-17 MED ORDER — PROPOFOL 10 MG/ML IV BOLUS
INTRAVENOUS | Status: AC
  Filled 2016-02-17: qty 20

## 2016-02-17 MED ORDER — BUPIVACAINE HCL (PF) 0.25 % IJ SOLN
INTRAMUSCULAR | Status: AC
  Filled 2016-02-17: qty 30

## 2016-02-17 MED ORDER — LACTATED RINGERS IV SOLN
INTRAVENOUS | Status: DC | PRN
  Administered 2016-02-17: 1000 mL

## 2016-02-17 MED ORDER — FENTANYL CITRATE (PF) 100 MCG/2ML IJ SOLN
INTRAMUSCULAR | Status: DC | PRN
  Administered 2016-02-17: 100 ug via INTRAVENOUS

## 2016-02-17 MED ORDER — LIDOCAINE HCL (CARDIAC) 20 MG/ML IV SOLN
INTRAVENOUS | Status: AC
  Filled 2016-02-17: qty 5

## 2016-02-17 MED ORDER — TRAMADOL HCL 50 MG PO TABS
100.0000 mg | ORAL_TABLET | Freq: Four times a day (QID) | ORAL | Status: DC | PRN
  Administered 2016-02-17: 100 mg via ORAL
  Filled 2016-02-17: qty 2

## 2016-02-17 MED ORDER — DEXAMETHASONE SODIUM PHOSPHATE 10 MG/ML IJ SOLN
INTRAMUSCULAR | Status: DC | PRN
  Administered 2016-02-17: 10 mg via INTRAVENOUS

## 2016-02-17 MED ORDER — SUGAMMADEX SODIUM 200 MG/2ML IV SOLN
INTRAVENOUS | Status: DC | PRN
  Administered 2016-02-17: 200 mg via INTRAVENOUS

## 2016-02-17 MED ORDER — LABETALOL HCL 5 MG/ML IV SOLN
INTRAVENOUS | Status: AC
  Filled 2016-02-17: qty 4

## 2016-02-17 MED ORDER — LACTATED RINGERS IV SOLN
INTRAVENOUS | Status: DC
  Administered 2016-02-17 (×2): via INTRAVENOUS

## 2016-02-17 MED ORDER — CEFAZOLIN SODIUM-DEXTROSE 2-4 GM/100ML-% IV SOLN
INTRAVENOUS | Status: AC
  Filled 2016-02-17: qty 100

## 2016-02-17 MED ORDER — HEMOSTATIC AGENTS (NO CHARGE) OPTIME
TOPICAL | Status: DC | PRN
  Administered 2016-02-17: 1 via TOPICAL

## 2016-02-17 MED ORDER — LIDOCAINE HCL (CARDIAC) 20 MG/ML IV SOLN
INTRAVENOUS | Status: DC | PRN
  Administered 2016-02-17: 100 mg via INTRAVENOUS

## 2016-02-17 MED ORDER — GLYCOPYRROLATE 0.2 MG/ML IJ SOLN
INTRAMUSCULAR | Status: DC | PRN
  Administered 2016-02-17: 0.2 mg via INTRAVENOUS

## 2016-02-17 SURGICAL SUPPLY — 59 items
APL ESCP 34 STRL LF DISP (HEMOSTASIS) ×1
APPLICATOR SURGIFLO ENDO (HEMOSTASIS) ×1 IMPLANT
BAG SPEC RTRVL LRG 6X4 10 (ENDOMECHANICALS) ×1
CHLORAPREP W/TINT 26ML (MISCELLANEOUS) ×2 IMPLANT
COVER SURGICAL LIGHT HANDLE (MISCELLANEOUS) ×2 IMPLANT
COVER TIP SHEARS 8 DVNC (MISCELLANEOUS) ×1 IMPLANT
COVER TIP SHEARS 8MM DA VINCI (MISCELLANEOUS) ×1
DRAPE ARM DVNC X/XI (DISPOSABLE) ×4 IMPLANT
DRAPE COLUMN DVNC XI (DISPOSABLE) ×1 IMPLANT
DRAPE DA VINCI XI ARM (DISPOSABLE) ×4
DRAPE DA VINCI XI COLUMN (DISPOSABLE) ×1
DRAPE SHEET LG 3/4 BI-LAMINATE (DRAPES) ×4 IMPLANT
DRAPE SURG IRRIG POUCH 19X23 (DRAPES) ×2 IMPLANT
ELECT REM PT RETURN 15FT ADLT (MISCELLANEOUS) ×2 IMPLANT
GLOVE BIO SURGEON STRL SZ 6 (GLOVE) ×8 IMPLANT
GLOVE BIO SURGEON STRL SZ 6.5 (GLOVE) ×4 IMPLANT
GOWN STRL REUS W/ TWL LRG LVL3 (GOWN DISPOSABLE) ×2 IMPLANT
GOWN STRL REUS W/TWL LRG LVL3 (GOWN DISPOSABLE) ×4
HOLDER FOLEY CATH W/STRAP (MISCELLANEOUS) ×2 IMPLANT
KIT BASIN OR (CUSTOM PROCEDURE TRAY) ×2 IMPLANT
KIT PROCEDURE DA VINCI SI (MISCELLANEOUS)
KIT PROCEDURE DVNC SI (MISCELLANEOUS) IMPLANT
LIQUID BAND (GAUZE/BANDAGES/DRESSINGS) ×2 IMPLANT
MANIPULATOR UTERINE 4.5 ZUMI (MISCELLANEOUS) ×2 IMPLANT
MARKER SKIN DUAL TIP RULER LAB (MISCELLANEOUS) ×2 IMPLANT
NDL SAFETY ECLIPSE 18X1.5 (NEEDLE) ×1 IMPLANT
NDL SPNL 18GX3.5 QUINCKE PK (NEEDLE) ×1 IMPLANT
NEEDLE HYPO 18GX1.5 SHARP (NEEDLE) ×2
NEEDLE SPNL 18GX3.5 QUINCKE PK (NEEDLE) ×2 IMPLANT
OBTURATOR XI 8MM BLADELESS (TROCAR) ×2 IMPLANT
OCCLUDER COLPOPNEUMO (BALLOONS) ×2 IMPLANT
PAD POSITIONER PINK NONSTERILE (MISCELLANEOUS) ×2 IMPLANT
PAD POSITIONING PINK XL (MISCELLANEOUS) ×2 IMPLANT
PORT ACCESS TROCAR AIRSEAL 12 (TROCAR) ×1 IMPLANT
PORT ACCESS TROCAR AIRSEAL 5M (TROCAR) ×1
POUCH ENDO CATCH II 15MM (MISCELLANEOUS) IMPLANT
POUCH SPECIMEN RETRIEVAL 10MM (ENDOMECHANICALS) ×1 IMPLANT
SEAL CANN UNIV 5-8 DVNC XI (MISCELLANEOUS) ×4 IMPLANT
SEAL XI 5MM-8MM UNIVERSAL (MISCELLANEOUS) ×4
SET TRI-LUMEN FLTR TB AIRSEAL (TUBING) ×2 IMPLANT
SET TUBE IRRIG SUCTION NO TIP (IRRIGATION / IRRIGATOR) ×2 IMPLANT
SHEET LAVH (DRAPES) ×2 IMPLANT
SOLUTION ELECTROLUBE (MISCELLANEOUS) ×2 IMPLANT
SURGIFLO W/THROMBIN 8M KIT (HEMOSTASIS) ×1 IMPLANT
SUT MNCRL AB 4-0 PS2 18 (SUTURE) ×4 IMPLANT
SUT VIC AB 0 CT1 27 (SUTURE) ×2
SUT VIC AB 0 CT1 27XBRD ANTBC (SUTURE) ×1 IMPLANT
SYR 50ML LL SCALE MARK (SYRINGE) ×2 IMPLANT
SYRINGE 10CC LL (SYRINGE) ×2 IMPLANT
SYRINGE 20CC LL (MISCELLANEOUS) ×1 IMPLANT
TOWEL OR 17X26 10 PK STRL BLUE (TOWEL DISPOSABLE) ×4 IMPLANT
TOWEL OR NON WOVEN STRL DISP B (DISPOSABLE) ×2 IMPLANT
TRAP SPECIMEN MUCOUS 40CC (MISCELLANEOUS) IMPLANT
TRAY FOLEY W/METER SILVER 14FR (SET/KITS/TRAYS/PACK) ×2 IMPLANT
TRAY LAPAROSCOPIC (CUSTOM PROCEDURE TRAY) ×2 IMPLANT
TROCAR BLADELESS OPT 5 100 (ENDOMECHANICALS) ×2 IMPLANT
UNDERPAD 30X30 (UNDERPADS AND DIAPERS) ×2 IMPLANT
UNDERPAD 30X30 INCONTINENT (UNDERPADS AND DIAPERS) ×2 IMPLANT
WATER STERILE IRR 1500ML POUR (IV SOLUTION) ×4 IMPLANT

## 2016-02-17 NOTE — Anesthesia Postprocedure Evaluation (Signed)
Anesthesia Post Note  Patient: Janice Phillips  Procedure(s) Performed: Procedure(s) (LRB): XI ROBOTIC ASSISTED TOTAL HYSTERECTOMY WITH BILATERAL SALPINGO OOPHORECTOMY AND SENTAL LYMPH NODE BIOPSY (Bilateral)  Patient location during evaluation: PACU Anesthesia Type: General Level of consciousness: awake and alert Pain management: pain level controlled Vital Signs Assessment: post-procedure vital signs reviewed and stable Respiratory status: spontaneous breathing, nonlabored ventilation, respiratory function stable and patient connected to nasal cannula oxygen Cardiovascular status: blood pressure returned to baseline and stable Postop Assessment: no signs of nausea or vomiting Anesthetic complications: no    Last Vitals:  Filed Vitals:   02/17/16 1300 02/17/16 1318  BP:  124/72  Pulse: 67 77  Temp:  36.8 C  Resp: 13 16    Last Pain:  Filed Vitals:   02/17/16 1319  PainSc: 3                  Layce Sprung J

## 2016-02-17 NOTE — Op Note (Signed)
OPERATIVE NOTE 02/17/16  Surgeon: Donaciano Eva   Assistants: Dr Lahoma Crocker (an MD assistant was necessary for tissue manipulation, management of robotic instrumentation, retraction and positioning due to the complexity of the case and hospital policies).   Anesthesia: General endotracheal anesthesia  ASA Class: 2   Pre-operative Diagnosis: grade 1 endometrial cancer  Post-operative Diagnosis: same  Operation: Robotic-assisted laparoscopic total hysterectomy with bilateral salpingoophorectomy, sentinel lymph node biopsy  Surgeon: Donaciano Eva  Assistant Surgeon: Lahoma Crocker MD  Anesthesia: GET  Urine Output: 300  Operative Findings:  : 12cm fibroid uterus, normal ovaries and tubes, no gross extrauterine disease, bilateral SLN mapping  Estimated Blood Loss:  less than 50 mL      Total IV Fluids: 600 ml         Specimen:.uterus, cervix, bilateral tubes and ovaries, right external iliac SLN, right common iliac SLN, left external iliac SLN.         Complications:  None; patient tolerated the procedure well.         Disposition: PACU - hemodynamically stable.  Procedure Details  The patient was seen in the Holding Room. The risks, benefits, complications, treatment options, and expected outcomes were discussed with the patient.  The patient concurred with the proposed plan, giving informed consent.  The site of surgery properly noted/marked. The patient was identified as Janice Phillips and the procedure verified as a Robotic-assisted hysterectomy with bilateral salpingo oophorectomy. A Time Out was held and the above information confirmed.  After induction of anesthesia, the patient was draped and prepped in the usual sterile manner. Pt was placed in supine position after anesthesia and draped and prepped in the usual sterile manner. The abdominal drape was placed after the CholoraPrep had been allowed to dry for 3 minutes.  Her arms were tucked to her  side with all appropriate precautions.  The shoulders were stabilized with padded shoulder blocks applied to the acromium processes.  The patient was placed in the semi-lithotomy position in [place].  The perineum was prepped with Betadine. The patient was then prepped. Foley catheter was placed.  A sterile speculum was placed in the vagina.  The cervix was grasped with a single-tooth tenaculum and dilated with Kennon Rounds dilators. 1mg  total of ICG was injected into the cervical stroma at 2 and 9 o'clock at a 54mm depth (concentration 0..5mg /ml). The ZUMI uterine manipulator with a medium colpotomizer ring was placed without difficulty.  A pneum occluder balloon was placed over the manipulator.  OG tube placement was confirmed and to suction.   Next, a 5 mm skin incision was made 1 cm below the subcostal margin in the midclavicular line.  The 5 mm Optiview port and scope was used for direct entry  Opening pressure was under 10 mm CO2.  The abdomen was insufflated and the findings were noted as above.   At this point and all points during the procedure, the patient's intra-abdominal pressure did not exceed 15 mmHg. Next, a 10 mm skin incision was made in the umbilicus and a right and left port was placed about 10 cm lateral to the robot port on the right and left side.  A fourth arm was placed in the left lower quadrant 2 cm above and superior and medial to the anterior superior iliac spine.  All ports were placed under direct visualization.  The patient was placed in steep Trendelenburg.  Bowel was folded away into the upper abdomen.  The robot was docked in  the normal manner.  The right and left peritoneum were opened parallel to the IP ligament to open the retroperitoneal spaces bilaterally. The SLN mapping was performed in bilateral pelvic basins. The para rectal and paravesical spaces were opened up. Lymphatic channels were identified travelling to the following visualized sentinel lymph node's: right  external iliac and right common iliac and left external iliac SLN. These SLN's were separated from their surrounding lymphatic tissue, removed and sent for permanent pathology.  The hysterectomy was started after the round ligament on the right side was incised and the retroperitoneum was entered and the pararectal space was developed.  The ureter was noted to be on the medial leaf of the broad ligament.  The peritoneum above the ureter was incised and stretched and the infundibulopelvic ligament was skeletonized, cauterized and cut.  The posterior peritoneum was taken down to the level of the KOH ring.  The anterior peritoneum was also taken down.  The bladder flap was created to the level of the KOH ring.  The uterine artery on the right side was skeletonized, cauterized and cut in the normal manner.  A similar procedure was performed on the left.  The colpotomy was made and the uterus, cervix, bilateral ovaries and tubes were amputated and delivered through the vagina.  Pedicles were inspected and excellent hemostasis was achieved.    The colpotomy at the vaginal cuff was closed with Vicryl on a CT1 needle in a running manner.  Irrigation was used and excellent hemostasis was achieved.  At this point in the procedure was completed.  Robotic instruments were removed under direct visulaization.  The robot was undocked. The 10 mm ports were closed with Vicryl on a UR-5 needle and the fascia was closed with 0 Vicryl on a UR-5 needle.  The skin was closed with 4-0 Vicryl in a subcuticular manner.  Dermabond was applied.  Sponge, lap and needle counts correct x 2.  The patient was taken to the recovery room in stable condition.  The vagina was swabbed with  minimal bleeding noted.   All instrument and needle counts were correct x  3.   The patient was transferred to the recovery room in a stable condition.  Donaciano Eva, MD

## 2016-02-17 NOTE — Transfer of Care (Signed)
Immediate Anesthesia Transfer of Care Note  Patient: Janice Phillips  Procedure(s) Performed: Procedure(s): XI ROBOTIC ASSISTED TOTAL HYSTERECTOMY WITH BILATERAL SALPINGO OOPHORECTOMY AND SENTAL LYMPH NODE BIOPSY (Bilateral)  Patient Location: PACU  Anesthesia Type:General  Level of Consciousness: sedated  Airway & Oxygen Therapy: Patient Spontanous Breathing and Patient connected to face mask oxygen  Post-op Assessment: Report given to RN and Post -op Vital signs reviewed and stable  Post vital signs: Reviewed and stable  Last Vitals:  Filed Vitals:   02/17/16 0753  BP: 155/79  Pulse: 87  Temp: 36.7 C  Resp: 20    Last Pain: There were no vitals filed for this visit.    Patients Stated Pain Goal: 4 ([id number])  Complications: No apparent anesthesia complications

## 2016-02-17 NOTE — Anesthesia Procedure Notes (Signed)
Procedure Name: Intubation Date/Time: 02/17/2016 10:09 AM Performed by: Lind Covert Pre-anesthesia Checklist: Patient identified, Emergency Drugs available, Suction available and Patient being monitored Patient Re-evaluated:Patient Re-evaluated prior to inductionOxygen Delivery Method: Circle System Utilized Preoxygenation: Pre-oxygenation with 100% oxygen Intubation Type: IV induction Ventilation: Mask ventilation without difficulty Laryngoscope Size: Miller and 2 Grade View: Grade II Tube type: Oral Number of attempts: 1 Airway Equipment and Method: Stylet and Oral airway Placement Confirmation: ETT inserted through vocal cords under direct vision,  positive ETCO2 and breath sounds checked- equal and bilateral Secured at: 22 cm Tube secured with: Tape Dental Injury: Teeth and Oropharynx as per pre-operative assessment  Comments: Cricoid pressure applied

## 2016-02-17 NOTE — H&P (View-Only) (Signed)
Consult Note: Gyn-Onc  Consult was requested by Dr. Philis Pique for the evaluation of Janice Phillips 65 y.o. female  CC:  Chief Complaint  Patient presents with  . New Consultation    endometrial cancer    Assessment/Plan:  Janice Phillips  is a 65 y.o.  year old with grade 1 endometrial adenocarcinoma on D&C.   A detailed discussion was held with the patient and her family with regard to to her endometrial cancer diagnosis. We discussed the standard management options for uterine cancer which includes surgery followed possibly by adjuvant therapy depending on the results of surgery. The options for surgical management include a hysterectomy and removal of the tubes and ovaries possibly with removal of pelvic and para-aortic lymph nodes. A minimally invasive approach including a robotic hysterectomy or laparoscopic hysterectomy have benefits including shorter hospital stay, recovery time and better wound healing. The alternative approach is an open hysterectomy. The patient has been counseled about these surgical options and the risks of surgery in general including infection, bleeding, damage to surrounding structures (including bowel, bladder, ureters, nerves or vessels), and the postoperative risks of PE/ DVT, and lymphedema. I extensively reviewed the additional risks of robotic hysterectomy including possible need for conversion to open laparotomy.  I discussed positioning during surgery of trendelenberg and risks of minor facial swelling and care we take in preoperative positioning.  After counseling and consideration of her options, she desires to proceed with robotic assisted total laparoscopic hysterectomy, BSO, sentinel lymph node biopsy.   She will be seen by anesthesia for preoperative clearance and discussion of postoperative pain management.  She was given the opportunity to ask questions, which were answered to her satisfaction, and she is agreement with the above mentioned plan of  care. She experiences severe post op nausea and vomiting and we will discuss this further with anesthesia at the time of her surgery to prophylax.   HPI: Janice Phillips is a 65 year old parous woman who is seen in consultation at the request of Dr Philis Pique for grade 1 endometrial cancer. She has been having intermittent postmenopausal bleeding for approximately 5 years with several office biopsies that had always been benign. Most recently seh reported this symptom to Dr Philis Pique, who, on 01/13/16 took her to the operating room for a hysteroscopy and D&C. Intraoperative findings included uterine polyps. Final pathology revealed adenocarcinoma FIGO grade 1.   The patient is otherwise very healthy and has had no abdominal surgeries. She has severe postoperative nausea and vomiting and is very "sensitive" to anesthesia.  She is easily oversedated.  Her BMI is overweight at 29kg/m2.   Current Meds:  Outpatient Encounter Prescriptions as of 02/02/2016  Medication Sig  . calcium gluconate 500 MG tablet Take 500 mg by mouth daily.  . cholecalciferol (VITAMIN D) 1000 UNITS tablet Take 1,000 Units by mouth daily.  . Cyanocobalamin (VITAMIN B-12 PO) Take 1 tablet by mouth daily.   . meclizine (ANTIVERT) 25 MG tablet Take 25 mg by mouth as needed (For vertigo.).   . [DISCONTINUED] CRESTOR 10 MG tablet Take 5 mg by mouth daily. Takes 2.5mg  daily  . [DISCONTINUED] Misc Natural Products (SUPER GREENS) POWD Take by mouth. Takes 2 scoops daily   No facility-administered encounter medications on file as of 02/02/2016.    Allergy:  Allergies  Allergen Reactions  . Vicodin [Hydrocodone-Acetaminophen] Nausea And Vomiting    Social Hx:   Social History   Social History  . Marital Status: Married  Spouse Name: N/A  . Number of Children: N/A  . Years of Education: N/A   Occupational History  . Not on file.   Social History Main Topics  . Smoking status: Never Smoker   . Smokeless tobacco: Never Used  .  Alcohol Use: No  . Drug Use: No  . Sexual Activity: Not on file   Other Topics Concern  . Not on file   Social History Narrative    Past Surgical Hx:  Past Surgical History  Procedure Laterality Date  . Dilation and curettage of uterus  1985    Past Medical Hx:  Past Medical History  Diagnosis Date  . Hyperlipidemia     Past Gynecological History:  SVD x 2  No LMP recorded. Patient is postmenopausal.  Family Hx:  Family History  Problem Relation Age of Onset  . Breast cancer Sister   . Colon cancer Maternal Grandfather 87  . Stomach cancer Neg Hx     Review of Systems:  Constitutional  Feels well,    ENT Normal appearing ears and nares bilaterally Skin/Breast  No rash, sores, jaundice, itching, dryness Cardiovascular  No chest pain, shortness of breath, or edema  Pulmonary  No cough or wheeze.  Gastro Intestinal  No nausea, vomitting, or diarrhoea. No bright red blood per rectum, no abdominal pain, change in bowel movement, or constipation.  Genito Urinary  No frequency, urgency, dysuria, + PMB Musculo Skeletal  No myalgia, arthralgia, joint swelling or pain  Neurologic  No weakness, numbness, change in gait,  Psychology  No depression, anxiety, insomnia.   Vitals:  Blood pressure 160/83, pulse 102, temperature 98.3 F (36.8 C), temperature source Oral, resp. rate 18, height 5\' 6"  (1.676 m), weight 181 lb 4.8 oz (82.237 kg), SpO2 99 %.  Physical Exam: WD in NAD Neck  Supple NROM, without any enlargements.  Lymph Node Survey No cervical supraclavicular or inguinal adenopathy Cardiovascular  Pulse normal rate, regularity and rhythm. S1 and S2 normal.  Lungs  Clear to auscultation bilateraly, without wheezes/crackles/rhonchi. Good air movement.  Skin  No rash/lesions/breakdown  Psychiatry  Alert and oriented to person, place, and time  Abdomen  Normoactive bowel sounds, abdomen soft, non-tender and slightly overweight without evidence of hernia.   Back No CVA tenderness Genito Urinary  Vulva/vagina: Normal external female genitalia.  No lesions. No discharge or bleeding.  Bladder/urethra:  No lesions or masses, well supported bladder  Vagina: normal  Cervix: Normal appearing, no lesions.  Uterus:  Otting, mobile, no parametrial involvement or nodularity.  Adnexa: no palpable masses. Rectal  Good tone, no masses no cul de sac nodularity.  Extremities  No bilateral cyanosis, clubbing or edema.   Donaciano Eva, MD  02/03/2016, 4:41 PM

## 2016-02-17 NOTE — Interval H&P Note (Signed)
History and Physical Interval Note:  02/17/2016 9:14 AM  Janice Phillips  has presented today for surgery, with the diagnosis of endometerial cancer  The various methods of treatment have been discussed with the patient and family. After consideration of risks, benefits and other options for treatment, the patient has consented to  Procedure(s): XI ROBOTIC ASSISTED TOTAL HYSTERECTOMY WITH BILATERAL SALPINGO OOPHORECTOMY AND SENTAL LYMPH NODE BIOPSY (Bilateral) as a surgical intervention .  The patient's history has been reviewed, patient examined, no change in status, stable for surgery.  I have reviewed the patient's chart and labs.  Questions were answered to the patient's satisfaction.     Donaciano Eva

## 2016-02-17 NOTE — Anesthesia Preprocedure Evaluation (Addendum)
Anesthesia Evaluation  Patient identified by MRN, date of birth, ID band Patient awake    Reviewed: Allergy & Precautions, NPO status , Patient's Chart, lab work & pertinent test results  History of Anesthesia Complications (+) PONV, Family history of anesthesia reaction and history of anesthetic complications  Airway Mallampati: II  TM Distance: >3 FB Neck ROM: Full    Dental no notable dental hx.    Pulmonary neg pulmonary ROS,    Pulmonary exam normal breath sounds clear to auscultation       Cardiovascular negative cardio ROS Normal cardiovascular exam Rhythm:Regular Rate:Normal     Neuro/Psych  Neuromuscular disease negative psych ROS   GI/Hepatic Neg liver ROS, GERD  ,  Endo/Other  negative endocrine ROS  Renal/GU negative Renal ROS  negative genitourinary   Musculoskeletal  (+) Fibromyalgia -  Abdominal   Peds negative pediatric ROS (+)  Hematology negative hematology ROS (+)   Anesthesia Other Findings   Reproductive/Obstetrics negative OB ROS                             Anesthesia Physical Anesthesia Plan  ASA: II  Anesthesia Plan: General   Post-op Pain Management:    Induction: Intravenous  Airway Management Planned: Oral ETT  Additional Equipment:   Intra-op Plan:   Post-operative Plan: Extubation in OR  Informed Consent: I have reviewed the patients History and Physical, chart, labs and discussed the procedure including the risks, benefits and alternatives for the proposed anesthesia with the patient or authorized representative who has indicated his/her understanding and acceptance.   Dental advisory given  Plan Discussed with: CRNA  Anesthesia Plan Comments: (Ms. Bureau expressed to me that she is sensitive to anesthesia, and took a long time to recover from the "fog" of anesthesia after knee arthroscopy. I assured her we would only give her what she needs,  would use the BIS monitor, and would minimize what we could. Questions answered.)       Anesthesia Quick Evaluation

## 2016-02-18 DIAGNOSIS — E663 Overweight: Secondary | ICD-10-CM | POA: Diagnosis not present

## 2016-02-18 DIAGNOSIS — K219 Gastro-esophageal reflux disease without esophagitis: Secondary | ICD-10-CM | POA: Diagnosis not present

## 2016-02-18 DIAGNOSIS — Z800 Family history of malignant neoplasm of digestive organs: Secondary | ICD-10-CM | POA: Diagnosis not present

## 2016-02-18 DIAGNOSIS — M797 Fibromyalgia: Secondary | ICD-10-CM | POA: Diagnosis not present

## 2016-02-18 DIAGNOSIS — Z885 Allergy status to narcotic agent status: Secondary | ICD-10-CM | POA: Diagnosis not present

## 2016-02-18 DIAGNOSIS — E785 Hyperlipidemia, unspecified: Secondary | ICD-10-CM | POA: Diagnosis not present

## 2016-02-18 DIAGNOSIS — Z6829 Body mass index (BMI) 29.0-29.9, adult: Secondary | ICD-10-CM | POA: Diagnosis not present

## 2016-02-18 DIAGNOSIS — D251 Intramural leiomyoma of uterus: Secondary | ICD-10-CM | POA: Diagnosis not present

## 2016-02-18 DIAGNOSIS — C541 Malignant neoplasm of endometrium: Secondary | ICD-10-CM | POA: Diagnosis not present

## 2016-02-18 DIAGNOSIS — Z803 Family history of malignant neoplasm of breast: Secondary | ICD-10-CM | POA: Diagnosis not present

## 2016-02-18 LAB — CBC
HCT: 37.9 % (ref 36.0–46.0)
Hemoglobin: 13.1 g/dL (ref 12.0–15.0)
MCH: 31.3 pg (ref 26.0–34.0)
MCHC: 34.6 g/dL (ref 30.0–36.0)
MCV: 90.5 fL (ref 78.0–100.0)
PLATELETS: 248 10*3/uL (ref 150–400)
RBC: 4.19 MIL/uL (ref 3.87–5.11)
RDW: 13 % (ref 11.5–15.5)
WBC: 16.6 10*3/uL — AB (ref 4.0–10.5)

## 2016-02-18 LAB — BASIC METABOLIC PANEL
ANION GAP: 8 (ref 5–15)
BUN: 11 mg/dL (ref 6–20)
CO2: 23 mmol/L (ref 22–32)
Calcium: 8.7 mg/dL — ABNORMAL LOW (ref 8.9–10.3)
Chloride: 103 mmol/L (ref 101–111)
Creatinine, Ser: 0.89 mg/dL (ref 0.44–1.00)
Glucose, Bld: 138 mg/dL — ABNORMAL HIGH (ref 65–99)
POTASSIUM: 4.2 mmol/L (ref 3.5–5.1)
SODIUM: 134 mmol/L — AB (ref 135–145)

## 2016-02-18 MED ORDER — TRAMADOL HCL 50 MG PO TABS: 50.0000 mg | ORAL_TABLET | Freq: Four times a day (QID) | ORAL | Status: DC | PRN

## 2016-02-18 NOTE — Discharge Instructions (Signed)
02/18/2016  Return to work: 4-6 weeks if applicable  Activity: 1. Be up and out of the bed during the day.  Take a nap if needed.  You may walk up steps but be careful and use the hand rail.  Stair climbing will tire you more than you think, you may need to stop part way and rest.   2. No lifting or straining for 6 weeks.  3. No driving for 1 week(s).  Do not drive if you are taking narcotic pain medicine.  4. Shower daily.  Use soap and water on your incision and pat dry; don't rub.  No tub baths until cleared by your surgeon.   5. No sexual activity and nothing in the vagina for 6 weeks.  6. You may experience a [place] amount of clear drainage from your incisions, which is normal.  If the drainage persists or increases, please call the office.  7. You may also experience vaginal spotting for up to eight weeks post-op.  Please call our office for moderate to heavy bleeding.  Diet: 1. Low sodium Heart Healthy Diet is recommended.  2. It is safe to use a laxative, such as Miralax or Colace, if you have difficulty moving your bowels.   Wound Care: 1. Keep clean and dry.  Shower daily.  Reasons to call the Doctor:  Fever - Oral temperature greater than 100.4 degrees Fahrenheit  Foul-smelling vaginal discharge  Difficulty urinating  Nausea and vomiting  Increased pain at the site of the incision that is unrelieved with pain medicine.  Difficulty breathing with or without chest pain  New calf pain especially if only on one side  Sudden, continuing increased vaginal bleeding with or without clots.   Contacts: For questions or concerns you should contact:  Dr. Everitt Amber at [phone]  Joylene John, NP at [phone]  After Hours: call [phone] and have the GYN Oncologist paged/contacted  Tramadol tablets What is this medicine? TRAMADOL (TRA ma dole) is a pain reliever. It is used to treat moderate to severe pain in adults. This medicine may be used for other  purposes; ask your health care provider or pharmacist if you have questions. What should I tell my health care provider before I take this medicine? They need to know if you have any of these conditions: -brain tumor -depression -drug abuse or addiction -head injury -if you frequently drink alcohol containing drinks -kidney disease or trouble passing urine -liver disease -lung disease, asthma, or breathing problems -seizures or epilepsy -suicidal thoughts, plans, or attempt; a previous suicide attempt by you or a family member -an unusual or allergic reaction to tramadol, codeine, other medicines, foods, dyes, or preservatives -pregnant or trying to get pregnant -breast-feeding How should I use this medicine? Take this medicine by mouth with a full glass of water. Follow the directions on the prescription label. If the medicine upsets your stomach, take it with food or milk. Do not take more medicine than you are told to take. Talk to your pediatrician regarding the use of this medicine in children. Special care may be needed. Overdosage: If you think you have taken too much of this medicine contact a poison control center or emergency room at once. NOTE: This medicine is only for you. Do not share this medicine with others. What if I miss a dose? If you miss a dose, take it as soon as you can. If it is almost time for your next dose, take only that dose. Do not take  double or extra doses. What may interact with this medicine? Do not take this medicine with any of the following medications: -MAOIs like Carbex, Eldepryl, Marplan, Nardil, and Parnate This medicine may also interact with the following medications: -alcohol or medicines that contain alcohol -antihistamines -benzodiazepines -bupropion -carbamazepine or oxcarbazepine -clozapine -cyclobenzaprine -digoxin -furazolidone -linezolid -medicines for depression, anxiety, or psychotic disturbances -medicines for migraine  headache like almotriptan, eletriptan, frovatriptan, naratriptan, rizatriptan, sumatriptan, zolmitriptan -medicines for pain like pentazocine, buprenorphine, butorphanol, meperidine, nalbuphine, and propoxyphene -medicines for sleep -muscle relaxants -naltrexone -phenobarbital -phenothiazines like perphenazine, thioridazine, chlorpromazine, mesoridazine, fluphenazine, prochlorperazine, promazine, and trifluoperazine -procarbazine -warfarin This list may not describe all possible interactions. Give your health care provider a list of all the medicines, herbs, non-prescription drugs, or dietary supplements you use. Also tell them if you smoke, drink alcohol, or use illegal drugs. Some items may interact with your medicine. What should I watch for while using this medicine? Tell your doctor or health care professional if your pain does not go away, if it gets worse, or if you have new or a different type of pain. You may develop tolerance to the medicine. Tolerance means that you will need a higher dose of the medicine for pain relief. Tolerance is normal and is expected if you take this medicine for a long time. Do not suddenly stop taking your medicine because you may develop a severe reaction. Your body becomes used to the medicine. This does NOT mean you are addicted. Addiction is a behavior related to getting and using a drug for a non-medical reason. If you have pain, you have a medical reason to take pain medicine. Your doctor will tell you how much medicine to take. If your doctor wants you to stop the medicine, the dose will be slowly lowered over time to avoid any side effects. You may get drowsy or dizzy. Do not drive, use machinery, or do anything that needs mental alertness until you know how this medicine affects you. Do not stand or sit up quickly, especially if you are an older patient. This reduces the risk of dizzy or fainting spells. Alcohol can increase or decrease the effects of this  medicine. Avoid alcoholic drinks. You may have constipation. Try to have a bowel movement at least every 2 to 3 days. If you do not have a bowel movement for 3 days, call your doctor or health care professional. Your mouth may get dry. Chewing sugarless gum or sucking hard candy, and drinking plenty of water may help. Contact your doctor if the problem does not go away or is severe. What side effects may I notice from receiving this medicine? Side effects that you should report to your doctor or health care professional as soon as possible: -allergic reactions like skin rash, itching or hives, swelling of the face, lips, or tongue -breathing difficulties, wheezing -confusion -itching -light headedness or fainting spells -redness, blistering, peeling or loosening of the skin, including inside the mouth -seizures Side effects that usually do not require medical attention (report to your doctor or health care professional if they continue or are bothersome): -constipation -dizziness -drowsiness -headache -nausea, vomiting This list may not describe all possible side effects. Call your doctor for medical advice about side effects. You may report side effects to FDA at 1-800-FDA-1088. Where should I keep my medicine? Keep out of the reach of children. This medicine may cause accidental overdose and death if it taken by other adults, children, or pets. Mix any unused medicine  with a substance like cat litter or coffee grounds. Then throw the medicine away in a sealed container like a sealed bag or a coffee can with a lid. Do not use the medicine after the expiration date. Store at room temperature between 15 and 30 degrees C (59 and 86 degrees F). NOTE: This sheet is a summary. It may not cover all possible information. If you have questions about this medicine, talk to your doctor, pharmacist, or health care provider.    2016, Elsevier/Gold Standard. (2013-11-02 15:42:09)  Abdominal  Hysterectomy, Care After These instructions give you information on caring for yourself after your procedure. Your doctor may also give you more specific instructions. Call your doctor if you have any problems or questions after your procedure.  HOME CARE It takes 4-6 weeks to recover from this surgery. Follow all of your doctor's instructions.   Only take medicines as told by your doctor.  Do not douche, use tampons, or have sex (intercourse) for at least 6 weeks or as told.  Follow your doctor's advice about exercise, lifting objects, driving, and general activities.  Get plenty of rest and sleep.  Do not lift anything heavier than a gallon of milk (about 10 pounds [4.5 kilograms]) for the first month after surgery.  Get back to your normal diet as told by your doctor.  Do not drink alcohol until your doctor says it is okay.  Take a medicine to help you poop (laxative) as told by your doctor.  Eating foods high in fiber may help you poop. Eat a lot of raw fruits and vegetables, whole grains, and beans.  Drink enough fluids to keep your pee (urine) clear or pale yellow.  Have someone help you at home for 1-2 weeks after your surgery.  Keep follow-up doctor visits as told. GET HELP IF:  You have chills or fever.  You have puffiness, redness, or pain in area of the cut (incision).  You have yellowish-white fluid (pus) coming from the cut.  You have a bad smell coming from the cut or bandage.  Your cut pulls apart.  You feel dizzy or light-headed.  You have pain or bleeding when you pee.  You keep having watery poop (diarrhea).  You keep feeling sick to your stomach (nauseous) or keep throwing up (vomiting).  You have fluid (discharge) coming from your vagina.  You have a rash.  You have a reaction to your medicine.  You need stronger pain medicine. GET HELP RIGHT AWAY IF:   You have a fever and your symptoms suddenly get worse.  You have bad belly (abdominal)  pain.  You have chest pain.  You are short of breath.  You pass out (faint).  You have pain, puffiness, or redness of your leg.  You bleed a lot from your vagina and notice clumps of tissue (clots). MAKE SURE YOU:   Understand these instructions.  Will watch your condition.  Will get help right away if you are not doing well or get worse.   This information is not intended to replace advice given to you by your health care provider. Make sure you discuss any questions you have with your health care provider.   Document Released: 06/15/2008 Document Revised: 09/11/2013 Document Reviewed: 06/29/2013 Elsevier Interactive Patient Education [employer].

## 2016-02-18 NOTE — Progress Notes (Signed)
Janice Phillips to be D/C'd Home per MD order.  Discussed with the patient & husband and all questions fully answered.  VSS, Skin clean, dry and intact without evidence of skin break down, no evidence of skin tears noted. IV catheter discontinued intact. Site without signs and symptoms of complications. Dressing and pressure applied.  An After Visit Summary was printed and given to the patient. Patient received prescription.  D/c education completed with patient/family including follow up instructions, medication list, d/c activities limitations if indicated, with other d/c instructions as indicated by MD - patient able to verbalize understanding, all questions fully answered.   Patient instructed to return to ED, call 911, or call MD for any changes in condition.   Patient escorted via [place], and D/C home via private auto.  [name] 02/18/2016 10:10 AM

## 2016-02-18 NOTE — Discharge Summary (Signed)
Physician Discharge Summary  Patient ID: Janice Phillips MRN: [id number] DOB/AGE: September 07, 1951 65 y.o.  Admit date: 02/17/2016 Discharge date: 02/18/2016  Admission Diagnoses: Endometrial cancer [facility])  Discharge Diagnoses:  Principal Problem:   Endometrial cancer [facility])   Discharged Condition:  The patient is in good condition and stable for discharge.    Hospital Course: On 02/17/2016, the patient underwent the following: Procedure(s): XI ROBOTIC ASSISTED TOTAL HYSTERECTOMY WITH BILATERAL SALPINGO OOPHORECTOMY AND SENTAL LYMPH NODE BIOPSY.   The postoperative course was uneventful.  She was discharged to home on postoperative day 1 tolerating a regular diet, minimal pain, voiding, ambulating without difficulty.  Consults: None  Significant Diagnostic Studies: None  Treatments: surgery: see above  Discharge Exam: Blood pressure 115/59, pulse 71, temperature 97.6 F (36.4 C), temperature source Oral, resp. rate 15, height 5\' 6"  (1.676 m), weight 181 lb (82.101 kg), SpO2 98 %. General appearance: alert, cooperative and no distress Resp: clear to auscultation bilaterally Cardio: regular rate and rhythm, S1, S2 normal, no murmur, click, rub or gallop GI: soft, non-tender; bowel sounds normal; no masses,  no organomegaly Extremities: extremities normal, atraumatic, no cyanosis or edema Incision/Wound: Lap sites to the abdomen with dermabond without erythema or drainage  Disposition: Home  Discharge Instructions    Call MD for:  difficulty breathing, headache or visual disturbances    Complete by:  As directed      Call MD for:  extreme fatigue    Complete by:  As directed      Call MD for:  hives    Complete by:  As directed      Call MD for:  persistant dizziness or light-headedness    Complete by:  As directed      Call MD for:  persistant nausea and vomiting    Complete by:  As directed      Call MD for:  redness, tenderness, or signs of infection (pain, swelling, redness, odor  or green/yellow discharge around incision site)    Complete by:  As directed      Call MD for:  severe uncontrolled pain    Complete by:  As directed      Call MD for:  temperature >100.4    Complete by:  As directed      Diet - low sodium heart healthy    Complete by:  As directed      Driving Restrictions    Complete by:  As directed   No driving for 1 week.  Do not take narcotics and drive.     Increase activity slowly    Complete by:  As directed      Lifting restrictions    Complete by:  As directed   No lifting greater than 10 lbs.     Sexual Activity Restrictions    Complete by:  As directed   No sexual activity, nothing in the vagina, for 6 weeks.            Medication List    TAKE these medications        calcium gluconate 500 MG tablet  Take 500 mg by mouth daily.     cholecalciferol 1000 units tablet  Commonly known as:  VITAMIN D  Take 1,000 Units by mouth daily.     ibuprofen 200 MG tablet  Commonly known as:  ADVIL,MOTRIN  Take 400 mg by mouth every 6 (six) hours as needed for headache.     meclizine 25 MG tablet  Commonly  known as:  ANTIVERT  Take 25 mg by mouth as needed (For vertigo.).     rosuvastatin 5 MG tablet  Commonly known as:  CRESTOR  Take 2.5 mg by mouth every Sunday.     traMADol 50 MG tablet  Commonly known as:  ULTRAM  Take 1-2 tablets (50-100 mg total) by mouth every 6 (six) hours as needed.     VITAMIN B-12 PO  Take 1 tablet by mouth daily.           Follow-up Information    Follow up with Donaciano Eva, MD On 03/15/2016.   Specialty:  Obstetrics and Gynecology   Why:  at 1:30pm at the [facility] information:   [place]  [phone]       Greater than thirty minutes were spend for face to face discharge instructions and discharge orders/summary in EPIC.   Signed: CROSS, MELISSA DEAL 02/18/2016, 9:34 AM

## 2016-02-21 ENCOUNTER — Telehealth: Payer: Self-pay | Admitting: Gynecologic Oncology

## 2016-02-21 NOTE — Telephone Encounter (Signed)
Patient has rash in distribution of abdominal prep. Refractory to benadryl and "miserable" with itch.  Prescribed medrol dose pack via [facility] on [address].  Donaciano Eva, MD

## 2016-02-24 ENCOUNTER — Telehealth: Payer: Self-pay | Admitting: Gynecologic Oncology

## 2016-02-24 NOTE — Telephone Encounter (Signed)
Patient informed of final path results.  Patient stating she is doing well post-operatively.  No concerns voiced.  Advised to call for any needs or concerns. Follow up appt already arranged for end of June.

## 2016-03-08 ENCOUNTER — Ambulatory Visit: Payer: BLUE CROSS/BLUE SHIELD | Admitting: Gynecologic Oncology

## 2016-03-11 DIAGNOSIS — E538 Deficiency of other specified B group vitamins: Secondary | ICD-10-CM | POA: Diagnosis not present

## 2016-03-11 DIAGNOSIS — E785 Hyperlipidemia, unspecified: Secondary | ICD-10-CM | POA: Diagnosis not present

## 2016-03-15 ENCOUNTER — Ambulatory Visit: Payer: BLUE CROSS/BLUE SHIELD | Attending: Gynecologic Oncology | Admitting: Gynecologic Oncology

## 2016-03-15 ENCOUNTER — Encounter: Payer: Self-pay | Admitting: Gynecologic Oncology

## 2016-03-15 VITALS — BP 159/86 | HR 112 | Temp 97.5°F | Resp 18 | Wt 180.1 lb

## 2016-03-15 DIAGNOSIS — C541 Malignant neoplasm of endometrium: Secondary | ICD-10-CM | POA: Diagnosis not present

## 2016-03-15 DIAGNOSIS — Z90710 Acquired absence of both cervix and uterus: Secondary | ICD-10-CM | POA: Diagnosis not present

## 2016-03-15 DIAGNOSIS — Z888 Allergy status to other drugs, medicaments and biological substances status: Secondary | ICD-10-CM | POA: Insufficient documentation

## 2016-03-15 DIAGNOSIS — Z9889 Other specified postprocedural states: Secondary | ICD-10-CM | POA: Diagnosis not present

## 2016-03-15 DIAGNOSIS — N840 Polyp of corpus uteri: Secondary | ICD-10-CM | POA: Diagnosis not present

## 2016-03-15 DIAGNOSIS — E785 Hyperlipidemia, unspecified: Secondary | ICD-10-CM | POA: Insufficient documentation

## 2016-03-15 DIAGNOSIS — N950 Postmenopausal bleeding: Secondary | ICD-10-CM | POA: Diagnosis not present

## 2016-03-15 DIAGNOSIS — M797 Fibromyalgia: Secondary | ICD-10-CM | POA: Diagnosis not present

## 2016-03-15 DIAGNOSIS — K219 Gastro-esophageal reflux disease without esophagitis: Secondary | ICD-10-CM | POA: Insufficient documentation

## 2016-03-15 DIAGNOSIS — R21 Rash and other nonspecific skin eruption: Secondary | ICD-10-CM | POA: Insufficient documentation

## 2016-03-15 DIAGNOSIS — Z79899 Other long term (current) drug therapy: Secondary | ICD-10-CM | POA: Insufficient documentation

## 2016-03-15 NOTE — Progress Notes (Signed)
Endometrial Cancer Follow-up Note: Gyn-Onc  Consult was initially requested by Dr. Philis Pique for the evaluation of Janice Phillips 65 y.o. female  CC:  Chief Complaint  Patient presents with  . endometrial cancer    Post-op follow up    Assessment/Plan:  Janice Phillips  is a 65 y.o.  year old with stage IA grade 1 endometrial adenocarcinoma. S/p robotic hysterectomy, BSO, SLN biopsy on 02/17/16. Pathology revealed low risk factors for recurrence, therefore no adjuvant therapy is recommended according to NCCN guidelines.  I discussed risk for recurrence and typical symptoms encouraged her to notify us of these should they develop between visits.  I recommend she have follow-up every 6 months for 5 years in accordance with NCCN guidelines. Those visits should include symptom assessment, physical exam and pelvic examination. Pap smears are not indicated or recommended in the routine surveillance of endometrial cancer.  We discussed her rash and my uncertainty regarding its etiology. Possibly chlorhexadine, though she had rash in only partial areas exposed to this. She expressed frustration in my inability to definitely ascribe the source of her reaction. I recommended that she inform people in the future that she may be allergic to chlorhexadine as she broke out in a rash after its use. She also expressed frustration with the quality and lack of completeness in the discharge instructions regarding information about what to do with the dermabond glue as it begins to peel off the skin and the exact time that it is safe to resume intercourse (6 or 8 weeks).  I explained that in most cases 6 weeks is safe, however, there is no definitive test to assure the cuff has adequately healed and that vaginal cuff dehiscence is associated with early resumption of intercourse therefore it is prudent to wait 8 weeks.  For follow-up she will see Dr Philis Pique in 6 months and myself in 12 months.   HPI: Janice Phillips is  a 65 year old parous woman who is seen in consultation at the request of Dr Philis Pique for grade 1 endometrial cancer. She has been having intermittent postmenopausal bleeding for approximately 5 years with several office biopsies that had always been benign. Most recently seh reported this symptom to Dr Philis Pique, who, on 01/13/16 took her to the operating room for a hysteroscopy and D&C. Intraoperative findings included uterine polyps. Final pathology revealed adenocarcinoma FIGO grade 1.   The patient is otherwise very healthy and has had no abdominal surgeries. She has severe postoperative nausea and vomiting and is very "sensitive" to anesthesia.  She is easily oversedated.  Her BMI is overweight at 29kg/m2.   Interval Hx:  On 02/17/16 she underwent a robotic assisted total hysterectomy, BSO, bilateral SLN biopsy. Pathology revealed a 1.9cm endometrioid tumor with no myometrial invasion, no LVSI, no adnexal or cervical involvement and negative SLN's to ultrastaging. Her tumor was characterized as low risk for recurrence and no adjuvant therapy was recommended.  She developed a rash in a stripe across her upper abdomen, below her inframammary line, 2-3 days postop that improved substantially with a medrol dosepack. She otherwise has no major complaints including no bleeding, LE edema, or pain.  Current Meds:  Outpatient Encounter Prescriptions as of 03/15/2016  Medication Sig  . calcium gluconate 500 MG tablet Take 500 mg by mouth daily.  . cholecalciferol (VITAMIN D) 1000 UNITS tablet Take 1,000 Units by mouth daily.  . Cyanocobalamin (VITAMIN B-12 PO) Take 1 tablet by mouth daily.   . rosuvastatin (CRESTOR)  5 MG tablet Take 2.5 mg by mouth every Sunday.  Marland Kitchen ibuprofen (ADVIL,MOTRIN) 200 MG tablet Take 400 mg by mouth every 6 (six) hours as needed for headache. Reported on 03/15/2016  . meclizine (ANTIVERT) 25 MG tablet Take 25 mg by mouth as needed (For vertigo.). Reported on 03/15/2016  . traMADol  (ULTRAM) 50 MG tablet Take 1-2 tablets (50-100 mg total) by mouth every 6 (six) hours as needed. (Patient not taking: Reported on 03/15/2016)   No facility-administered encounter medications on file as of 03/15/2016.    Allergy:  Allergies  Allergen Reactions  . Vicodin [Hydrocodone-Acetaminophen] Nausea And Vomiting    Social Hx:   Social History   Social History  . Marital Status: Married    Spouse Name: N/A  . Number of Children: N/A  . Years of Education: N/A   Occupational History  . Not on file.   Social History Main Topics  . Smoking status: Never Smoker   . Smokeless tobacco: Never Used  . Alcohol Use: No  . Drug Use: No  . Sexual Activity: Not on file   Other Topics Concern  . Not on file   Social History Narrative    Past Surgical Hx:  Past Surgical History  Procedure Laterality Date  . Dilation and curettage of uterus  1985  . Colonoscopy      x 2  . Knee arthroscopy  2015  . Robotic assisted total hysterectomy with bilateral salpingo oopherectomy Bilateral 02/17/2016    Procedure: XI ROBOTIC ASSISTED TOTAL HYSTERECTOMY WITH BILATERAL SALPINGO OOPHORECTOMY AND SENTAL LYMPH NODE BIOPSY;  Surgeon: Everitt Amber, MD;  Location: [facility];  Service: Gynecology;  Laterality: Bilateral;    Past Medical Hx:  Past Medical History  Diagnosis Date  . Hyperlipidemia   . Low tolerance to pain medication     to all meds  . Complication of anesthesia     took a week to get over "fog" of anesthesia  . PONV (postoperative nausea and vomiting)   . Family history of adverse reaction to anesthesia     mother- n&v  . GERD (gastroesophageal reflux disease)     no meds currently  . Fibromyalgia   . Cancer [facility])     endometrial    Past Gynecological History:  SVD x 2  No LMP recorded. Patient is postmenopausal.  Family Hx:  Family History  Problem Relation Age of Onset  . Breast cancer Sister   . Colon cancer Maternal Grandfather 38  . Stomach cancer Neg Hx      Review of Systems:  Constitutional  Feels well,    ENT Normal appearing ears and nares bilaterally Skin/Breast  No rash, sores, jaundice, itching, dryness Cardiovascular  No chest pain, shortness of breath, or edema  Pulmonary  No cough or wheeze.  Gastro Intestinal  No nausea, vomitting, or diarrhoea. No bright red blood per rectum, no abdominal pain, change in bowel movement, or constipation.  Genito Urinary  No frequency, urgency, dysuria, + PMB Musculo Skeletal  No myalgia, arthralgia, joint swelling or pain  Neurologic  No weakness, numbness, change in gait,  Psychology  No depression, anxiety, insomnia.   Vitals:  Blood pressure 159/86, pulse 112, temperature 97.5 F (36.4 C), temperature source Oral, resp. rate 18, weight 180 lb 1.6 oz (81.693 kg), SpO2 98 %.  Physical Exam: WD in NAD Neck  Supple NROM, without any enlargements.  Lymph Node Survey No cervical supraclavicular or inguinal adenopathy Cardiovascular  Pulse normal rate, regularity  and rhythm. S1 and S2 normal.  Lungs  Clear to auscultation bilateraly, without wheezes/crackles/rhonchi. Good air movement.  Skin  No rash/lesions/breakdown  Psychiatry  Alert and oriented to person, place, and time  Abdomen  Normoactive bowel sounds, abdomen soft, non-tender and slightly overweight without evidence of hernia. Well healed incisions, no residual rash Back No CVA tenderness Genito Urinary  Vulva/vagina: Normal external female genitalia.  No lesions. No discharge or bleeding.  Bladder/urethra:  No lesions or masses, well supported bladder  Vagina: cuff in tact, no lesions, no bleeding  Adnexa: no palpable masses. Rectal  deferred Extremities  No bilateral cyanosis, clubbing or edema.   30 minutes of direct face to face counseling time was spent with the patient. This included discussion about prognosis, therapy recommendations and postoperative side effects and are beyond the scope of routine  postoperative care.  Donaciano Eva, MD  03/15/2016, 2:16 PM

## 2016-03-15 NOTE — Patient Instructions (Signed)
Plan to follow up with Dr. Philis Pique in six months and Dr. Denman George in one year.  Please call [phone] after you see Dr. Philis Pique to schedule your appointment.

## 2016-03-18 DIAGNOSIS — G629 Polyneuropathy, unspecified: Secondary | ICD-10-CM | POA: Diagnosis not present

## 2016-03-18 DIAGNOSIS — E785 Hyperlipidemia, unspecified: Secondary | ICD-10-CM | POA: Diagnosis not present

## 2016-03-18 DIAGNOSIS — E559 Vitamin D deficiency, unspecified: Secondary | ICD-10-CM | POA: Diagnosis not present

## 2016-04-01 ENCOUNTER — Telehealth: Payer: Self-pay

## 2016-04-01 DIAGNOSIS — F4324 Adjustment disorder with disturbance of conduct: Secondary | ICD-10-CM | POA: Diagnosis not present

## 2016-04-01 NOTE — Telephone Encounter (Addendum)
Incoming call , patient states she stopped bleeding , vaginally five days after surgery ( robotic assisted hysterectomy , BSO , SLN biopsy ) on May 30 , 2017 . Patient states she noted yellow discharge that turned into pink discharge a month after her surgery and just wanted to certain that this was expected . Patient states that the pink vaginal discharge is "minimal" and she is changing her panty liner three times a day . Patient denies fever,pain or any changes and is aware that Dr Everitt Amber will be contacted and updated with these changes. Dr Denman George updated , patient is to monitor and call on Monday July 17 , 2017 if the pink discharge does not "improve. Patient states understanding and will call on Monday as recommended by Dr Dorann Ou , if their is no improvement.

## 2016-04-05 NOTE — Telephone Encounter (Signed)
Attempted to contact the patient on her cell [place]: [phone] to update her that she has bee scheduled with Dr Everitt Amber for Wednesday April 07, 2016 at 11:15 AM for increased vaginal discharge .

## 2016-04-05 NOTE — Telephone Encounter (Addendum)
Incoming call from the patient to report that she has had an increase in the vaginal discharge ,patient states it is only "yellow" now.Patient states that she now changing her panty liner about six times a day compared to three times a day ,which was reported on Thursday July 13,2017. Will update Dr Denman George with reported changes.

## 2016-04-07 ENCOUNTER — Ambulatory Visit: Payer: BLUE CROSS/BLUE SHIELD | Attending: Gynecologic Oncology | Admitting: Gynecologic Oncology

## 2016-04-07 ENCOUNTER — Encounter: Payer: Self-pay | Admitting: Gynecologic Oncology

## 2016-04-07 VITALS — BP 158/88 | HR 98 | Temp 98.4°F | Resp 18 | Ht 66.0 in | Wt 181.7 lb

## 2016-04-07 DIAGNOSIS — Z800 Family history of malignant neoplasm of digestive organs: Secondary | ICD-10-CM | POA: Diagnosis not present

## 2016-04-07 DIAGNOSIS — C541 Malignant neoplasm of endometrium: Secondary | ICD-10-CM | POA: Diagnosis not present

## 2016-04-07 DIAGNOSIS — Z6829 Body mass index (BMI) 29.0-29.9, adult: Secondary | ICD-10-CM | POA: Diagnosis not present

## 2016-04-07 DIAGNOSIS — E663 Overweight: Secondary | ICD-10-CM | POA: Insufficient documentation

## 2016-04-07 DIAGNOSIS — Z803 Family history of malignant neoplasm of breast: Secondary | ICD-10-CM | POA: Diagnosis not present

## 2016-04-07 DIAGNOSIS — N898 Other specified noninflammatory disorders of vagina: Secondary | ICD-10-CM

## 2016-04-07 DIAGNOSIS — M797 Fibromyalgia: Secondary | ICD-10-CM | POA: Insufficient documentation

## 2016-04-07 DIAGNOSIS — E785 Hyperlipidemia, unspecified: Secondary | ICD-10-CM | POA: Diagnosis not present

## 2016-04-07 DIAGNOSIS — K219 Gastro-esophageal reflux disease without esophagitis: Secondary | ICD-10-CM | POA: Diagnosis not present

## 2016-04-07 DIAGNOSIS — N840 Polyp of corpus uteri: Secondary | ICD-10-CM | POA: Insufficient documentation

## 2016-04-07 DIAGNOSIS — Z90722 Acquired absence of ovaries, bilateral: Secondary | ICD-10-CM | POA: Diagnosis not present

## 2016-04-07 DIAGNOSIS — Z90710 Acquired absence of both cervix and uterus: Secondary | ICD-10-CM | POA: Diagnosis not present

## 2016-04-07 DIAGNOSIS — N950 Postmenopausal bleeding: Secondary | ICD-10-CM | POA: Insufficient documentation

## 2016-04-07 DIAGNOSIS — Z885 Allergy status to narcotic agent status: Secondary | ICD-10-CM | POA: Diagnosis not present

## 2016-04-07 MED ORDER — ESTROGENS, CONJUGATED 0.625 MG/GM VA CREA: 1.0000 | TOPICAL_CREAM | VAGINAL | Status: DC

## 2016-04-07 NOTE — Patient Instructions (Signed)
Plan to begin using premarin vaginal cream in the vagina three times a week for one month.  You can use a fingernail length amount and place in the opening of the vagina at bedtime.  Plan to follow up in four weeks or sooner if needed.

## 2016-04-07 NOTE — Progress Notes (Signed)
Endometrial Cancer Follow-up Note: Gyn-Onc  Consult was initially requested by Dr. Philis Pique for the evaluation of Janice Phillips 65 y.o. female  CC:  Chief Complaint  Patient presents with  . Increased vaginal discharge    follow up visit    Assessment/Plan:  [name]  is a 65 y.o.  year old with stage IA grade 1 endometrial adenocarcinoma. S/p robotic hysterectomy, BSO, SLN biopsy on 02/17/16, low risk factors for recurrence, therefore no adjuvant therapy is recommended according to NCCN guidelines.  Vaginal mucosal separation at vaginal cuff. No dehiscence or evisceration. Carter lesion. Do not recommend surgical repair as the lesion is very [place] and is likely to heal by secondary intention.   Vaginal premarin 3 times per week x 1 month to hasten healing.  Will re-evaluate in 1 month. Discussed no intercourse until area has healed as there is decreased vaginal cuff integrity.  I discussed risk for recurrence and typical symptoms encouraged her to notify us of these should they develop between visits.  I recommend she have follow-up every 6 months for 5 years in accordance with NCCN guidelines. Those visits should include symptom assessment, physical exam and pelvic examination. Pap smears are not indicated or recommended in the routine surveillance of endometrial cancer.  For follow-up she will see me in 1 month, Dr Philis Pique in 6 months and myself in 12 months.   HPI: Janice Phillips is a 65 year old parous woman who is seen in consultation at the request of Dr Philis Pique for grade 1 endometrial cancer. She has been having intermittent postmenopausal bleeding for approximately 5 years with several office biopsies that had always been benign. Most recently seh reported this symptom to Dr Philis Pique, who, on 01/13/16 took her to the operating room for a hysteroscopy and D&C. Intraoperative findings included uterine polyps. Final pathology revealed adenocarcinoma FIGO grade 1.   The patient is  otherwise very healthy and has had no abdominal surgeries. She has severe postoperative nausea and vomiting and is very "sensitive" to anesthesia.  She is easily oversedated.  Her BMI is overweight at 29kg/m2.   On 02/17/16 she underwent a robotic assisted total hysterectomy, BSO, bilateral SLN biopsy. Pathology revealed a 1.9cm endometrioid tumor with no myometrial invasion, no LVSI, no adnexal or cervical involvement and negative SLN's to ultrastaging. Her tumor was characterized as low risk for recurrence and no adjuvant therapy was recommended.  Interval Hx:  6 weeks postop she developed some leakage of yellow fluid vaginally and pink tinged discharge (low volume). She has not yet initiated intercourse. She denies fevers or pain. It has improved in the past 3 days.  Current Meds:  Outpatient Encounter Prescriptions as of 04/07/2016  Medication Sig  . calcium gluconate 500 MG tablet Take 500 mg by mouth daily.  . cholecalciferol (VITAMIN D) 1000 UNITS tablet Take 1,000 Units by mouth daily.  . Cyanocobalamin (VITAMIN B-12 PO) Take 1 tablet by mouth daily.   Marland Kitchen ibuprofen (ADVIL,MOTRIN) 200 MG tablet Take 400 mg by mouth every 6 (six) hours as needed for headache. Reported on 03/15/2016  . meclizine (ANTIVERT) 25 MG tablet Take 25 mg by mouth as needed (For vertigo.). Reported on 03/15/2016  . rosuvastatin (CRESTOR) 5 MG tablet Take 2.5 mg by mouth every other day.   . conjugated estrogens (PREMARIN) vaginal cream Place 1 Applicatorful vaginally 3 (three) times a week.  . [DISCONTINUED] traMADol (ULTRAM) 50 MG tablet Take 1-2 tablets (50-100 mg total) by mouth every 6 (six)  hours as needed. (Patient not taking: Reported on 03/15/2016)   No facility-administered encounter medications on file as of 04/07/2016.    Allergy:  Allergies  Allergen Reactions  . Vicodin [Hydrocodone-Acetaminophen] Nausea And Vomiting    Social Hx:   Social History   Social History  . Marital Status: Married     Spouse Name: N/A  . Number of Children: N/A  . Years of Education: N/A   Occupational History  . Not on file.   Social History Main Topics  . Smoking status: Never Smoker   . Smokeless tobacco: Never Used  . Alcohol Use: No  . Drug Use: No  . Sexual Activity: Not on file   Other Topics Concern  . Not on file   Social History Narrative    Past Surgical Hx:  Past Surgical History  Procedure Laterality Date  . Dilation and curettage of uterus  1985  . Colonoscopy      x 2  . Knee arthroscopy  2015  . Robotic assisted total hysterectomy with bilateral salpingo oopherectomy Bilateral 02/17/2016    Procedure: XI ROBOTIC ASSISTED TOTAL HYSTERECTOMY WITH BILATERAL SALPINGO OOPHORECTOMY AND SENTAL LYMPH NODE BIOPSY;  Surgeon: Everitt Amber, MD;  Location: [facility];  Service: Gynecology;  Laterality: Bilateral;    Past Medical Hx:  Past Medical History  Diagnosis Date  . Hyperlipidemia   . Low tolerance to pain medication     to all meds  . Complication of anesthesia     took a week to get over "fog" of anesthesia  . PONV (postoperative nausea and vomiting)   . Family history of adverse reaction to anesthesia     mother- n&v  . GERD (gastroesophageal reflux disease)     no meds currently  . Fibromyalgia   . Cancer [facility])     endometrial    Past Gynecological History:  SVD x 2  No LMP recorded. Patient is postmenopausal.  Family Hx:  Family History  Problem Relation Age of Onset  . Breast cancer Sister   . Colon cancer Maternal Grandfather 73  . Stomach cancer Neg Hx     Review of Systems:  Constitutional  Feels well,    ENT Normal appearing ears and nares bilaterally Skin/Breast  No rash, sores, jaundice, itching, dryness Cardiovascular  No chest pain, shortness of breath, or edema  Pulmonary  No cough or wheeze.  Gastro Intestinal  No nausea, vomitting, or diarrhoea. No bright red blood per rectum, no abdominal pain, change in bowel movement, or constipation.   Genito Urinary  No frequency, urgency, dysuria, + yellow and pink watery discharge. Musculo Skeletal  No myalgia, arthralgia, joint swelling or pain  Neurologic  No weakness, numbness, change in gait,  Psychology  No depression, anxiety, insomnia.   Vitals:  Blood pressure 158/88, pulse 98, temperature 98.4 F (36.9 C), temperature source Oral, resp. rate 18, height 5\' 6"  (1.676 m), weight 181 lb 11.2 oz (82.419 kg), SpO2 99 %.  Physical Exam: WD in NAD Neck  Supple NROM, without any enlargements.  Lymph Node Survey No cervical supraclavicular or inguinal adenopathy Cardiovascular  Pulse normal rate, regularity and rhythm. S1 and S2 normal.  Lungs  Clear to auscultation bilateraly, without wheezes/crackles/rhonchi. Good air movement.  Skin  No rash/lesions/breakdown  Psychiatry  Alert and oriented to person, place, and time  Abdomen  Normoactive bowel sounds, abdomen soft, non-tender and slightly overweight without evidence of hernia. Well healed incisions, no residual rash Back No CVA tenderness Genito  Urinary  Vulva/vagina: Normal external female genitalia.  No lesions. No discharge or bleeding.  Bladder/urethra:  No lesions or masses, well supported bladder  Vagina: 1.5cm central area of vaginal cuff mucosal separation. Donlan amount of amber fluid from vagina. Deeper tissues and peritoneum in tact both visibly and palpably. No frank dehiscence.  Adnexa: no palpable masses. Rectal  deferred Extremities  No bilateral cyanosis, clubbing or edema.   Donaciano Eva, MD  04/07/2016, 12:14 PM

## 2016-04-08 DIAGNOSIS — F4324 Adjustment disorder with disturbance of conduct: Secondary | ICD-10-CM | POA: Diagnosis not present

## 2016-04-21 DIAGNOSIS — F4324 Adjustment disorder with disturbance of conduct: Secondary | ICD-10-CM | POA: Diagnosis not present

## 2016-04-28 DIAGNOSIS — F4324 Adjustment disorder with disturbance of conduct: Secondary | ICD-10-CM | POA: Diagnosis not present

## 2016-05-05 ENCOUNTER — Ambulatory Visit: Payer: BLUE CROSS/BLUE SHIELD | Admitting: Gynecologic Oncology

## 2016-05-10 ENCOUNTER — Encounter: Payer: Self-pay | Admitting: Gynecologic Oncology

## 2016-05-10 ENCOUNTER — Ambulatory Visit: Payer: BLUE CROSS/BLUE SHIELD | Attending: Gynecologic Oncology | Admitting: Gynecologic Oncology

## 2016-05-10 VITALS — BP 152/88 | HR 108 | Temp 98.3°F | Resp 18 | Wt 183.2 lb

## 2016-05-10 DIAGNOSIS — M797 Fibromyalgia: Secondary | ICD-10-CM | POA: Insufficient documentation

## 2016-05-10 DIAGNOSIS — Z90710 Acquired absence of both cervix and uterus: Secondary | ICD-10-CM | POA: Insufficient documentation

## 2016-05-10 DIAGNOSIS — E785 Hyperlipidemia, unspecified: Secondary | ICD-10-CM | POA: Insufficient documentation

## 2016-05-10 DIAGNOSIS — Z885 Allergy status to narcotic agent status: Secondary | ICD-10-CM | POA: Diagnosis not present

## 2016-05-10 DIAGNOSIS — K219 Gastro-esophageal reflux disease without esophagitis: Secondary | ICD-10-CM | POA: Insufficient documentation

## 2016-05-10 DIAGNOSIS — C541 Malignant neoplasm of endometrium: Secondary | ICD-10-CM | POA: Diagnosis not present

## 2016-05-10 DIAGNOSIS — L928 Other granulomatous disorders of the skin and subcutaneous tissue: Secondary | ICD-10-CM | POA: Diagnosis not present

## 2016-05-10 DIAGNOSIS — Z803 Family history of malignant neoplasm of breast: Secondary | ICD-10-CM | POA: Insufficient documentation

## 2016-05-10 DIAGNOSIS — N840 Polyp of corpus uteri: Secondary | ICD-10-CM | POA: Diagnosis not present

## 2016-05-10 DIAGNOSIS — Z800 Family history of malignant neoplasm of digestive organs: Secondary | ICD-10-CM | POA: Diagnosis not present

## 2016-05-10 DIAGNOSIS — Z90722 Acquired absence of ovaries, bilateral: Secondary | ICD-10-CM | POA: Insufficient documentation

## 2016-05-10 NOTE — Patient Instructions (Signed)
Follow up with Dr Everitt Amber in August 2018 , come sooner if you experience any vaginal bleeding , changes or concerns . Please call in March of 2018 to schedule your appointment.   Thank you !

## 2016-05-10 NOTE — Progress Notes (Signed)
Endometrial Cancer Follow-up Note: Gyn-Onc  Consult was initially requested by Dr. Philis Pique for the evaluation of Janice Phillips 65 y.o. female  CC:  Chief Complaint  Patient presents with  . endometrial cancer    follow up visit    Assessment/Plan:  [name]  is a 65 y.o.  year old with stage IA grade 1 endometrial adenocarcinoma. S/p robotic hysterectomy, BSO, SLN biopsy on 02/17/16, low risk factors for recurrence, therefore no adjuvant therapy is recommended according to NCCN guidelines.  Vaginal mucosal separation now healed with Mcgaughy (51mm) area of granulation tissue centrally. Can discontinue vaginal premarin. Cleared to resume intercourse. Discussed she may have some mild spotting due to granulation tissue.   I discussed risk for recurrence and typical symptoms encouraged her to notify us of these should they develop between visits.  I recommend she have follow-up every 6 months for 5 years in accordance with NCCN guidelines. Those visits should include symptom assessment, physical exam and pelvic examination. Pap smears are not indicated or recommended in the routine surveillance of endometrial cancer.  For follow-up she will see me in 1 month, Dr Philis Pique in 6 months and myself in 12 months.   HPI: Janice Phillips is a 65 year old parous woman who is seen in consultation at the request of Dr Philis Pique for grade 1 endometrial cancer. She has been having intermittent postmenopausal bleeding for approximately 5 years with several office biopsies that had always been benign. Most recently seh reported this symptom to Dr Philis Pique, who, on 01/13/16 took her to the operating room for a hysteroscopy and D&C. Intraoperative findings included uterine polyps. Final pathology revealed adenocarcinoma FIGO grade 1.   The patient is otherwise very healthy and has had no abdominal surgeries. She has severe postoperative nausea and vomiting and is very "sensitive" to anesthesia.  She is easily  oversedated.  Her BMI is overweight at 29kg/m2.   On 02/17/16 she underwent a robotic assisted total hysterectomy, BSO, bilateral SLN biopsy. Pathology revealed a 1.9cm endometrioid tumor with no myometrial invasion, no LVSI, no adnexal or cervical involvement and negative SLN's to ultrastaging. Her tumor was characterized as low risk for recurrence and no adjuvant therapy was recommended.  She developed a 2cm area of cuff mucosal separation (centrally).  Interval Hx:  She has been using premarin 3 times per week x 4 weeks. Denies bleeding or drainage.  Current Meds:  Outpatient Encounter Prescriptions as of 05/10/2016  Medication Sig  . calcium gluconate 500 MG tablet Take 500 mg by mouth daily.  . cholecalciferol (VITAMIN D) 1000 UNITS tablet Take 1,000 Units by mouth daily.  Marland Kitchen conjugated estrogens (PREMARIN) vaginal cream Place 1 Applicatorful vaginally 3 (three) times a week.  . Cyanocobalamin (VITAMIN B-12 PO) Take 1 tablet by mouth daily.   Marland Kitchen ibuprofen (ADVIL,MOTRIN) 200 MG tablet Take 400 mg by mouth every 6 (six) hours as needed for headache. Reported on 03/15/2016  . rosuvastatin (CRESTOR) 5 MG tablet Take 2.5 mg by mouth every other day.   . meclizine (ANTIVERT) 25 MG tablet Take 25 mg by mouth as needed (For vertigo.). Reported on 03/15/2016   No facility-administered encounter medications on file as of 05/10/2016.     Allergy:  Allergies  Allergen Reactions  . Vicodin [Hydrocodone-Acetaminophen] Nausea And Vomiting    Social Hx:   Social History   Social History  . Marital status: Married    Spouse name: N/A  . Number of children: N/A  . Years  of education: N/A   Occupational History  . Not on file.   Social History Main Topics  . Smoking status: Never Smoker  . Smokeless tobacco: Never Used  . Alcohol use No  . Drug use: No  . Sexual activity: Not on file   Other Topics Concern  . Not on file   Social History Narrative  . No narrative on file    Past  Surgical Hx:  Past Surgical History:  Procedure Laterality Date  . COLONOSCOPY     x 2  . DILATION AND CURETTAGE OF UTERUS  1985  . KNEE ARTHROSCOPY  2015  . ROBOTIC ASSISTED TOTAL HYSTERECTOMY WITH BILATERAL SALPINGO OOPHERECTOMY Bilateral 02/17/2016   Procedure: XI ROBOTIC ASSISTED TOTAL HYSTERECTOMY WITH BILATERAL SALPINGO OOPHORECTOMY AND SENTAL LYMPH NODE BIOPSY;  Surgeon: Everitt Amber, MD;  Location: [facility];  Service: Gynecology;  Laterality: Bilateral;    Past Medical Hx:  Past Medical History:  Diagnosis Date  . Cancer [facility])    endometrial  . Complication of anesthesia    took a week to get over "fog" of anesthesia  . Family history of adverse reaction to anesthesia    mother- n&v  . Fibromyalgia   . GERD (gastroesophageal reflux disease)    no meds currently  . Hyperlipidemia   . Low tolerance to pain medication    to all meds  . PONV (postoperative nausea and vomiting)     Past Gynecological History:  SVD x 2  No LMP recorded. Patient is postmenopausal.  Family Hx:  Family History  Problem Relation Age of Onset  . Breast cancer Sister   . Colon cancer Maternal Grandfather 33  . Stomach cancer Neg Hx     Review of Systems:  Constitutional  Feels well,    ENT Normal appearing ears and nares bilaterally Skin/Breast  No rash, sores, jaundice, itching, dryness Cardiovascular  No chest pain, shortness of breath, or edema  Pulmonary  No cough or wheeze.  Gastro Intestinal  No nausea, vomitting, or diarrhoea. No bright red blood per rectum, no abdominal pain, change in bowel movement, or constipation.  Genito Urinary  No frequency, urgency, dysuria, no bleeding or discharge. Musculo Skeletal  No myalgia, arthralgia, joint swelling or pain  Neurologic  No weakness, numbness, change in gait,  Psychology  No depression, anxiety, insomnia.   Vitals:  Blood pressure (!) 152/88, pulse (!) 108, temperature 98.3 F (36.8 C), temperature source Oral, resp. rate  18, weight 183 lb 3.2 oz (83.1 kg), SpO2 99 %.  Physical Exam: WD in NAD Neck  Supple NROM, without any enlargements.  Lymph Node Survey No cervical supraclavicular or inguinal adenopathy Cardiovascular  Pulse normal rate, regularity and rhythm. S1 and S2 normal.  Lungs  Clear to auscultation bilateraly, without wheezes/crackles/rhonchi. Good air movement.  Skin  No rash/lesions/breakdown  Psychiatry  Alert and oriented to person, place, and time  Abdomen  Normoactive bowel sounds, abdomen soft, non-tender and slightly overweight without evidence of hernia. Well healed incisions, no residual rash Back No CVA tenderness Genito Urinary  Vulva/vagina: Normal external female genitalia.  No lesions. No discharge or bleeding.  Bladder/urethra:  No lesions or masses, well supported bladder  Vagina: puckered area centrally of cuff with granulation tissue present. No mucosal separation. Palpably in tact cuff.  Adnexa: no palpable masses. Rectal  deferred Extremities  No bilateral cyanosis, clubbing or edema.   Donaciano Eva, MD  05/10/2016, 2:55 PM

## 2016-05-12 DIAGNOSIS — F4324 Adjustment disorder with disturbance of conduct: Secondary | ICD-10-CM | POA: Diagnosis not present

## 2016-05-18 DIAGNOSIS — L820 Inflamed seborrheic keratosis: Secondary | ICD-10-CM | POA: Diagnosis not present

## 2016-05-18 DIAGNOSIS — D2239 Melanocytic nevi of other parts of face: Secondary | ICD-10-CM | POA: Diagnosis not present

## 2016-05-18 DIAGNOSIS — L821 Other seborrheic keratosis: Secondary | ICD-10-CM | POA: Diagnosis not present

## 2016-06-01 IMAGING — CR DG CHEST 2V
2 series · 2 of 2 positions shown · non-contrast
Comparison: None.

CLINICAL DATA: Endometrial cancer.  Preoperative evaluation.

EXAM:
CHEST  2 VIEW

[w chest pa]
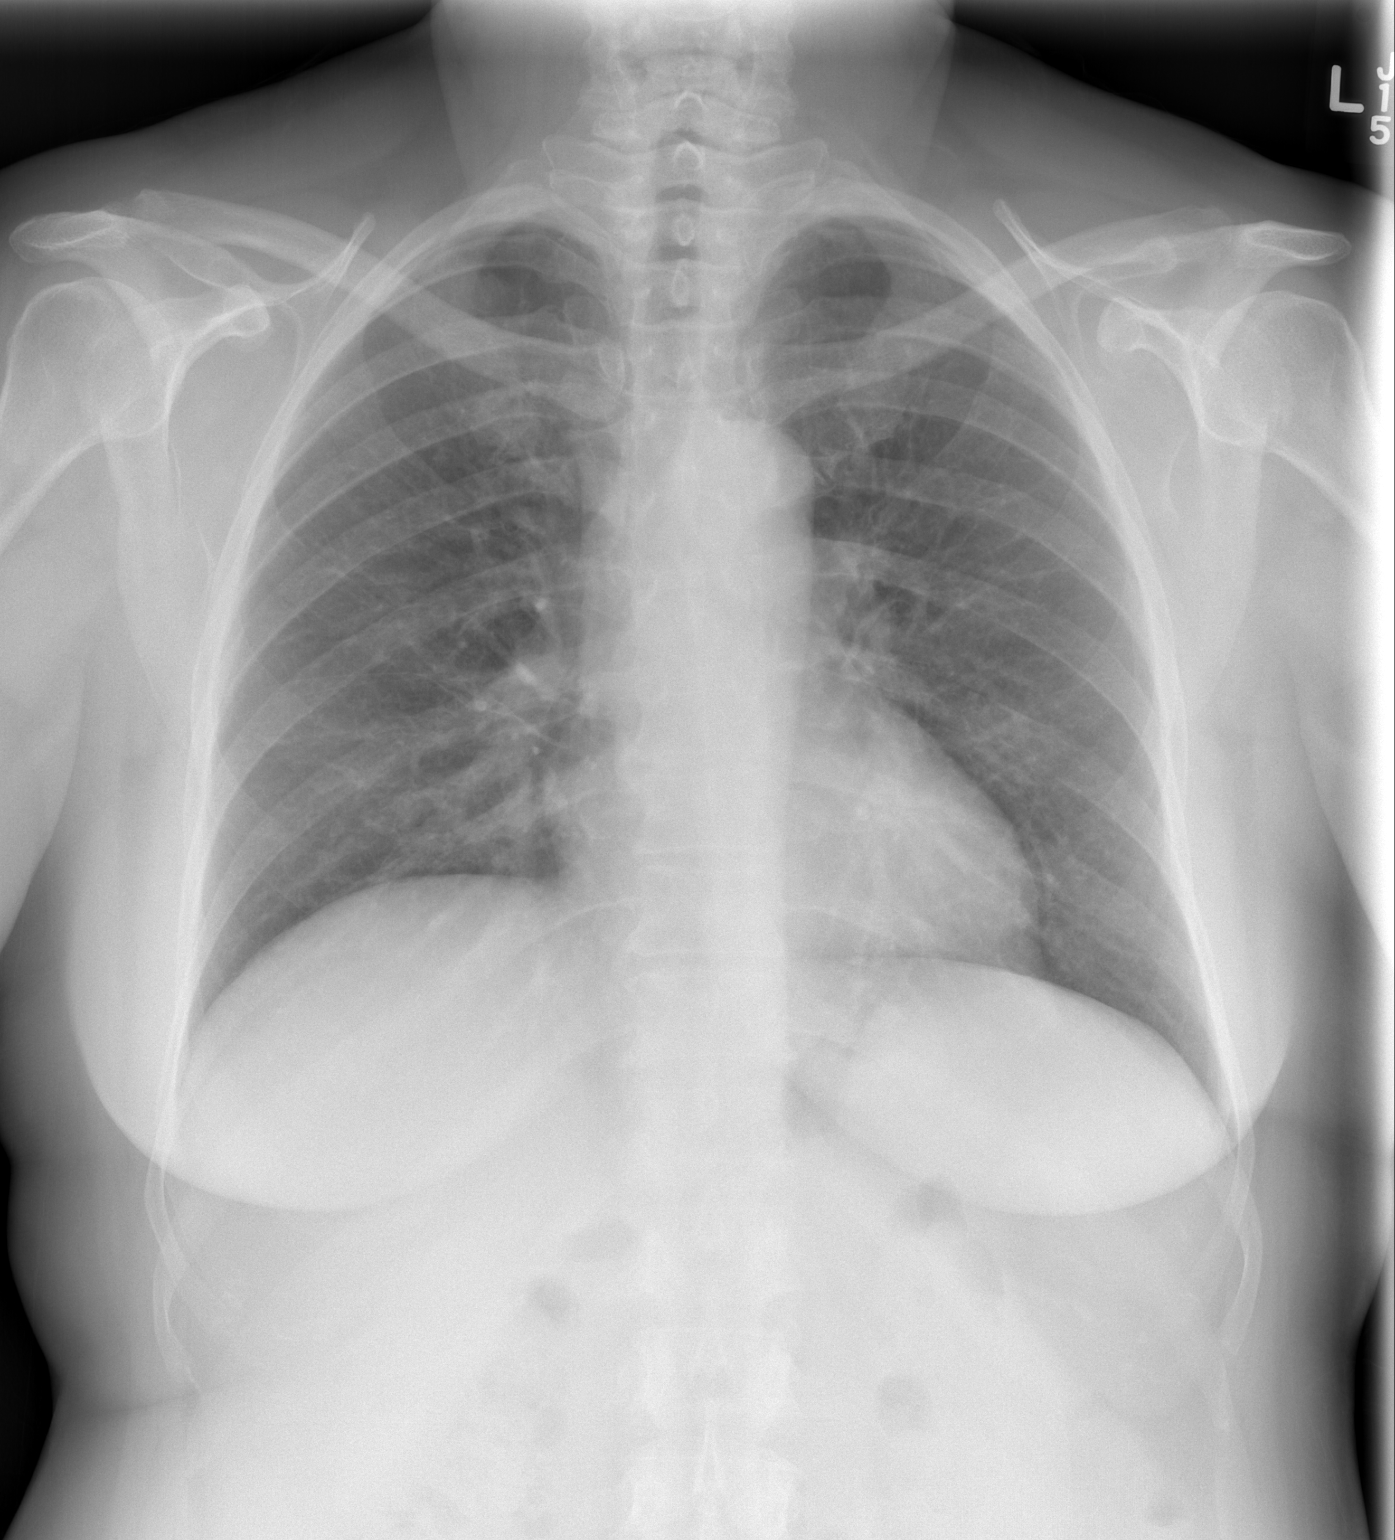

[w chest lat]
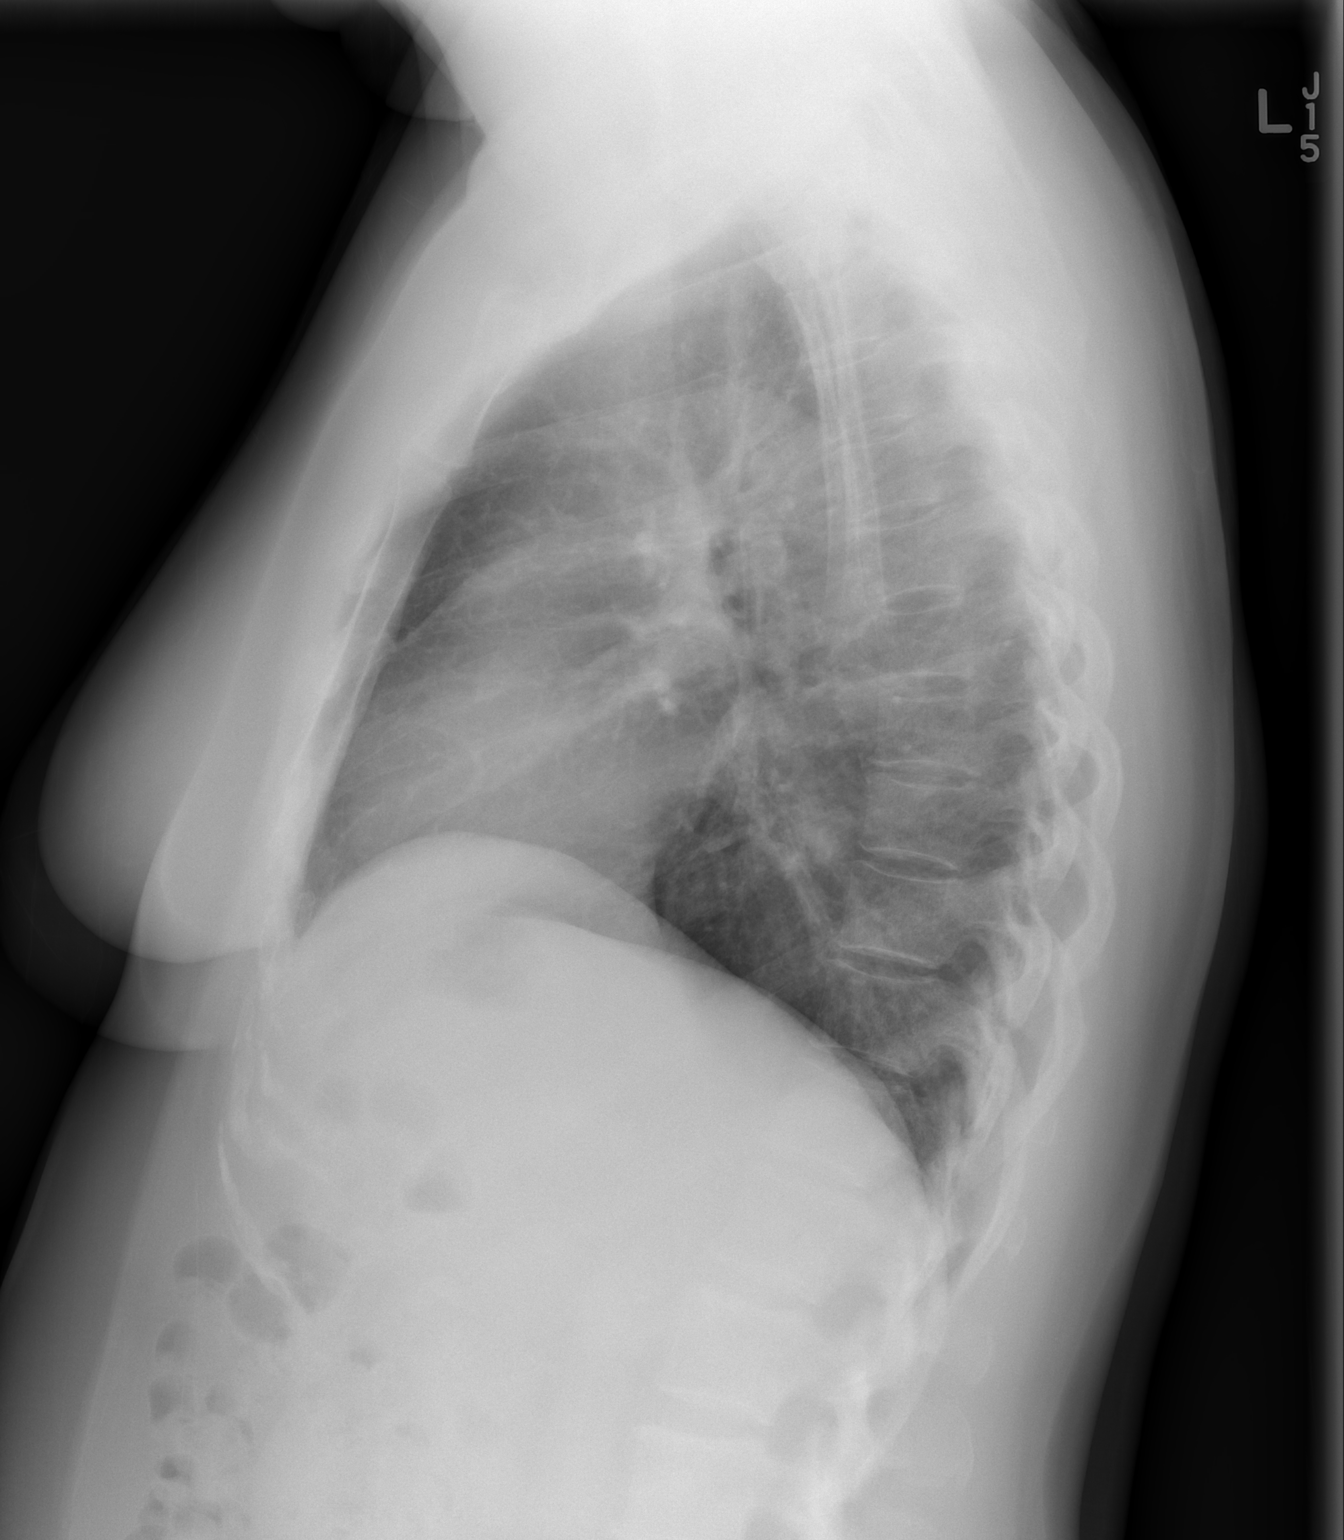

[2 of 2 positions shown; findings below may reference images not displayed]

FINDINGS: The heart size and mediastinal contours are within normal limits.
Both lungs are clear. The visualized skeletal structures are
unremarkable.
IMPRESSION: No active cardiopulmonary disease.

## 2016-06-14 DIAGNOSIS — G629 Polyneuropathy, unspecified: Secondary | ICD-10-CM | POA: Diagnosis not present

## 2016-06-14 DIAGNOSIS — E785 Hyperlipidemia, unspecified: Secondary | ICD-10-CM | POA: Diagnosis not present

## 2016-06-17 DIAGNOSIS — Z23 Encounter for immunization: Secondary | ICD-10-CM | POA: Diagnosis not present

## 2016-06-17 DIAGNOSIS — E785 Hyperlipidemia, unspecified: Secondary | ICD-10-CM | POA: Diagnosis not present

## 2016-06-22 DIAGNOSIS — S0502XA Injury of conjunctiva and corneal abrasion without foreign body, left eye, initial encounter: Secondary | ICD-10-CM | POA: Diagnosis not present

## 2016-06-23 DIAGNOSIS — S0502XD Injury of conjunctiva and corneal abrasion without foreign body, left eye, subsequent encounter: Secondary | ICD-10-CM | POA: Diagnosis not present

## 2016-06-23 DIAGNOSIS — H10412 Chronic giant papillary conjunctivitis, left eye: Secondary | ICD-10-CM | POA: Diagnosis not present

## 2016-06-23 DIAGNOSIS — H16102 Unspecified superficial keratitis, left eye: Secondary | ICD-10-CM | POA: Diagnosis not present

## 2016-07-02 DIAGNOSIS — L249 Irritant contact dermatitis, unspecified cause: Secondary | ICD-10-CM | POA: Diagnosis not present

## 2016-11-04 ENCOUNTER — Other Ambulatory Visit: Payer: Self-pay | Admitting: Obstetrics and Gynecology

## 2016-11-04 DIAGNOSIS — Z1231 Encounter for screening mammogram for malignant neoplasm of breast: Secondary | ICD-10-CM | POA: Diagnosis not present

## 2016-11-04 DIAGNOSIS — Z1272 Encounter for screening for malignant neoplasm of vagina: Secondary | ICD-10-CM | POA: Diagnosis not present

## 2016-11-04 DIAGNOSIS — Z01419 Encounter for gynecological examination (general) (routine) without abnormal findings: Secondary | ICD-10-CM | POA: Diagnosis not present

## 2016-11-04 DIAGNOSIS — Z6829 Body mass index (BMI) 29.0-29.9, adult: Secondary | ICD-10-CM | POA: Diagnosis not present

## 2016-11-08 LAB — CYTOLOGY - PAP

## 2016-11-09 ENCOUNTER — Other Ambulatory Visit: Payer: Self-pay | Admitting: Obstetrics and Gynecology

## 2016-11-09 DIAGNOSIS — R928 Other abnormal and inconclusive findings on diagnostic imaging of breast: Secondary | ICD-10-CM

## 2016-11-11 ENCOUNTER — Ambulatory Visit
Admission: RE | Admit: 2016-11-11 | Discharge: 2016-11-11 | Disposition: A | Discharge status: Home / Self Care | Payer: BLUE CROSS/BLUE SHIELD | Source: Ambulatory Visit | Attending: Obstetrics and Gynecology | Admitting: Obstetrics and Gynecology

## 2016-11-11 DIAGNOSIS — R928 Other abnormal and inconclusive findings on diagnostic imaging of breast: Secondary | ICD-10-CM

## 2016-11-25 DIAGNOSIS — Z0000 Encounter for general adult medical examination without abnormal findings: Secondary | ICD-10-CM | POA: Diagnosis not present

## 2016-11-25 DIAGNOSIS — E559 Vitamin D deficiency, unspecified: Secondary | ICD-10-CM | POA: Diagnosis not present

## 2016-11-25 DIAGNOSIS — E538 Deficiency of other specified B group vitamins: Secondary | ICD-10-CM | POA: Diagnosis not present

## 2016-11-25 DIAGNOSIS — Z131 Encounter for screening for diabetes mellitus: Secondary | ICD-10-CM | POA: Diagnosis not present

## 2016-12-02 DIAGNOSIS — R7309 Other abnormal glucose: Secondary | ICD-10-CM | POA: Diagnosis not present

## 2016-12-02 DIAGNOSIS — Z0000 Encounter for general adult medical examination without abnormal findings: Secondary | ICD-10-CM | POA: Diagnosis not present

## 2016-12-02 DIAGNOSIS — E785 Hyperlipidemia, unspecified: Secondary | ICD-10-CM | POA: Diagnosis not present

## 2016-12-02 DIAGNOSIS — N182 Chronic kidney disease, stage 2 (mild): Secondary | ICD-10-CM | POA: Diagnosis not present

## 2017-01-13 DIAGNOSIS — H5213 Myopia, bilateral: Secondary | ICD-10-CM | POA: Diagnosis not present

## 2017-01-13 DIAGNOSIS — H2513 Age-related nuclear cataract, bilateral: Secondary | ICD-10-CM | POA: Diagnosis not present

## 2017-02-28 ENCOUNTER — Telehealth: Payer: Self-pay | Admitting: *Deleted

## 2017-02-28 NOTE — Telephone Encounter (Signed)
Per patient request I have scheduled an appt for August 20th. Patient aware of the date/time

## 2017-03-01 IMAGING — MG 2D DIGITAL DIAGNOSTIC UNILATERAL RIGHT MAMMOGRAM WITH CAD AND AD
6 series · 6 of 14 positions shown · non-contrast
Comparison: 11/04/2016, 10/30/2015 and earlier.

CLINICAL DATA: Recall from screening mammography with
tomosynthesis, possible focal asymmetry in the outer right breast at
middle depth.

EXAM:
2D DIGITAL DIAGNOSTIC RIGHT MAMMOGRAM WITH ADJUNCT TOMO

[R CC synth-2D]
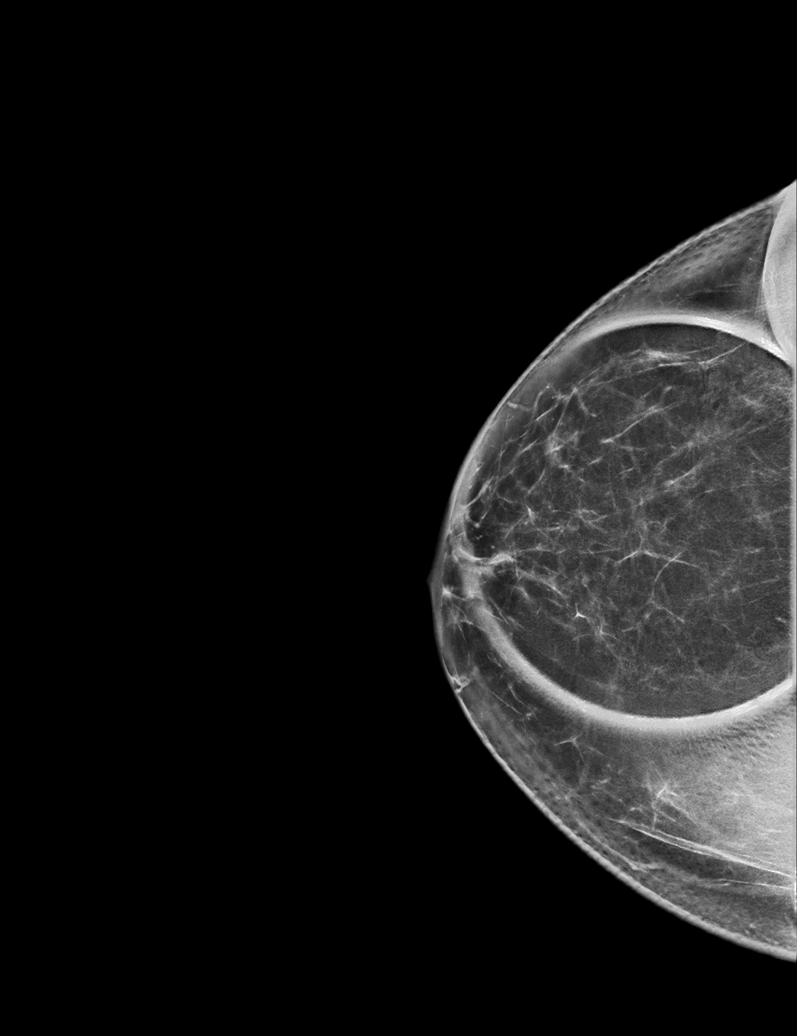

[R MLO]
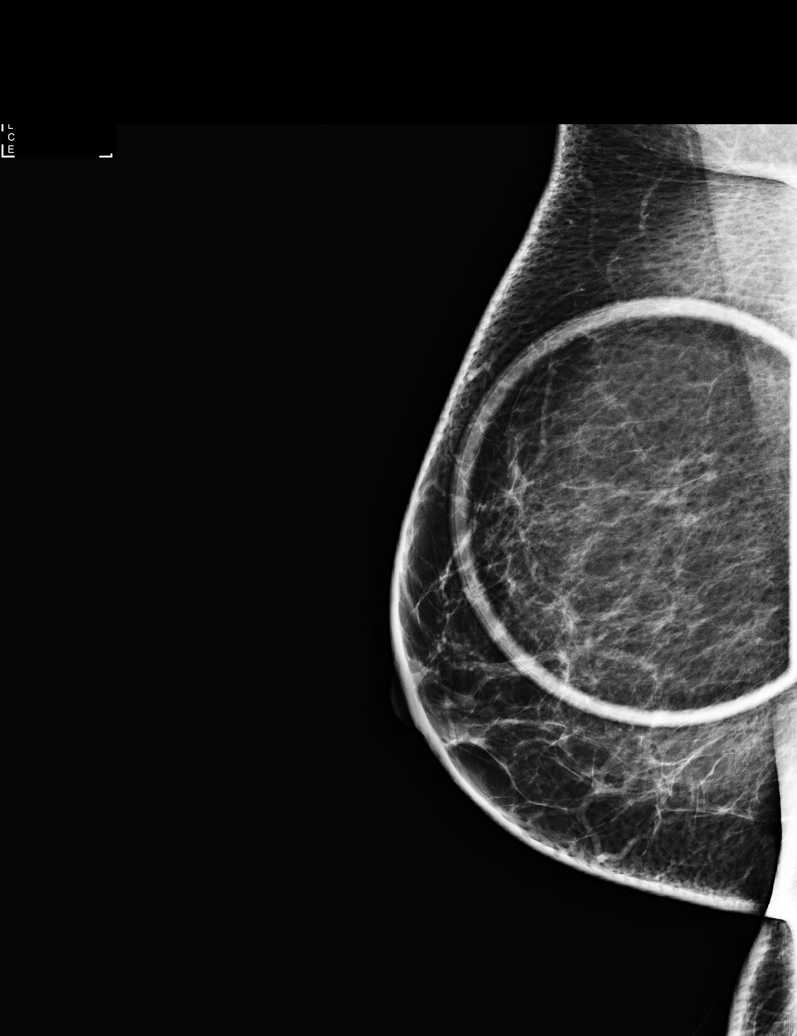

[R MLO synth-2D]
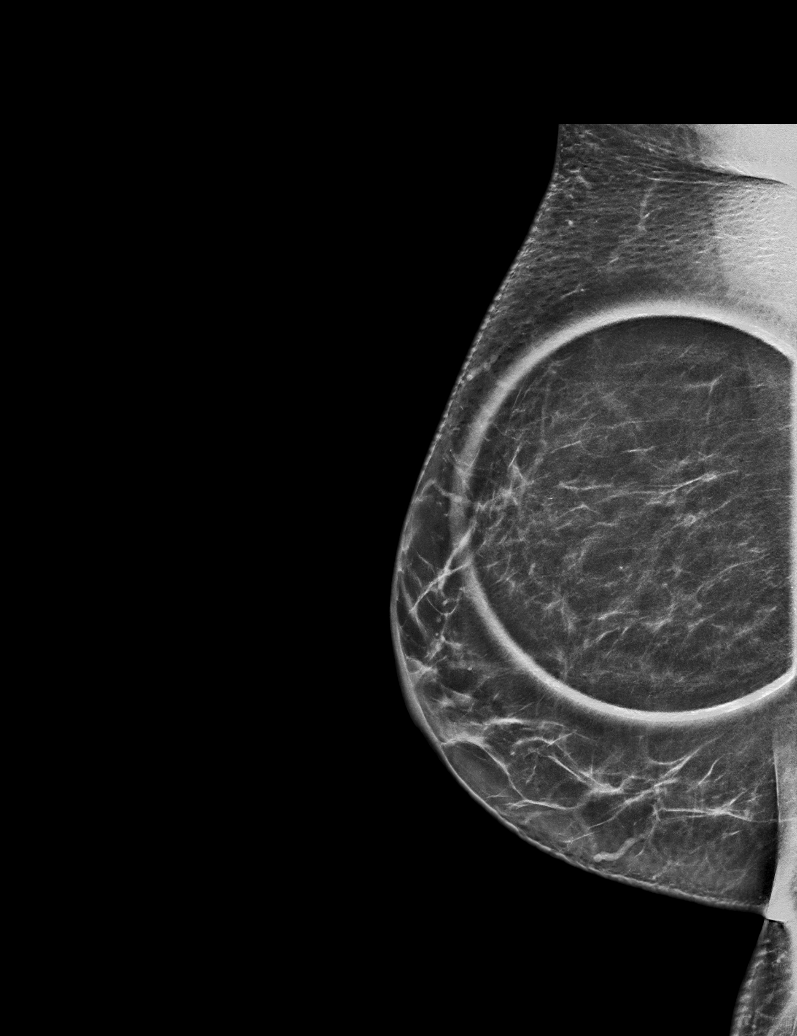

[R CC]
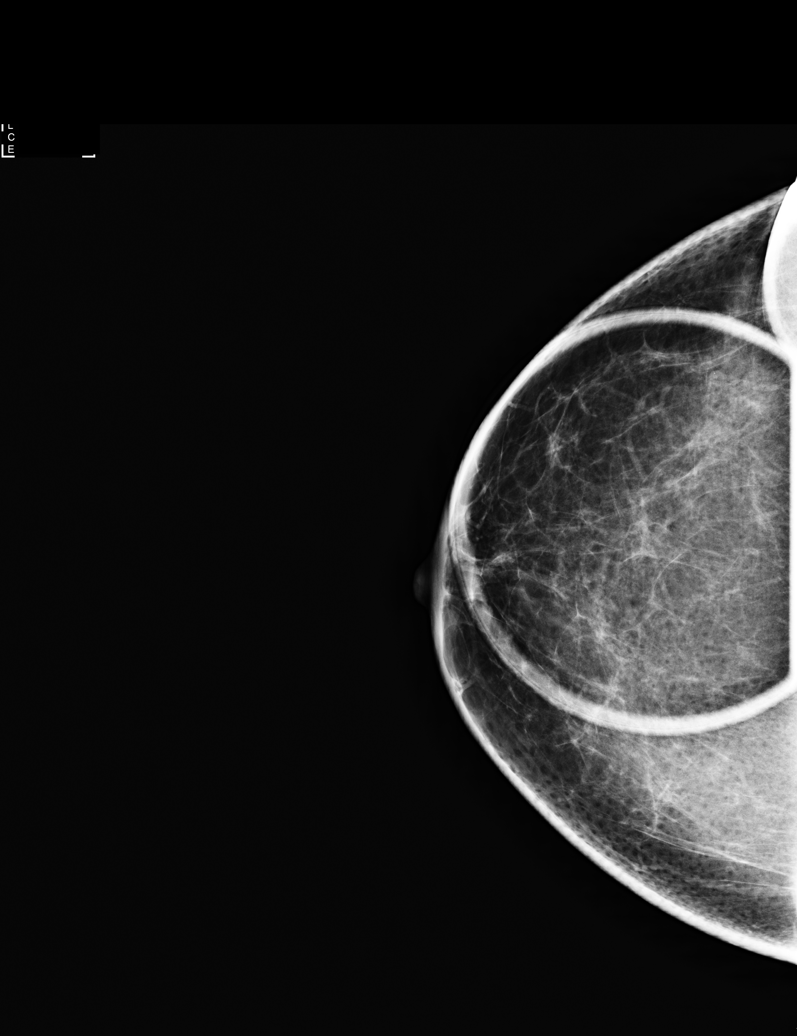

[R MLO tomo · tomo slice 41/80.0]
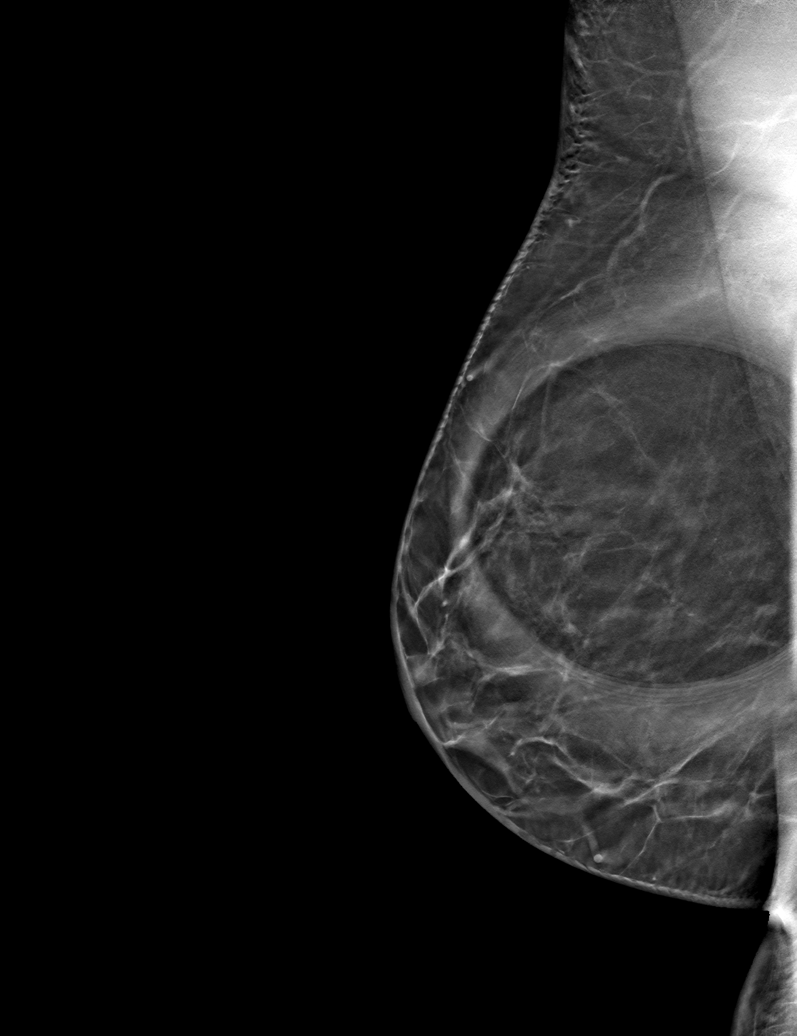

[R CC tomo · tomo slice 39/78.0]
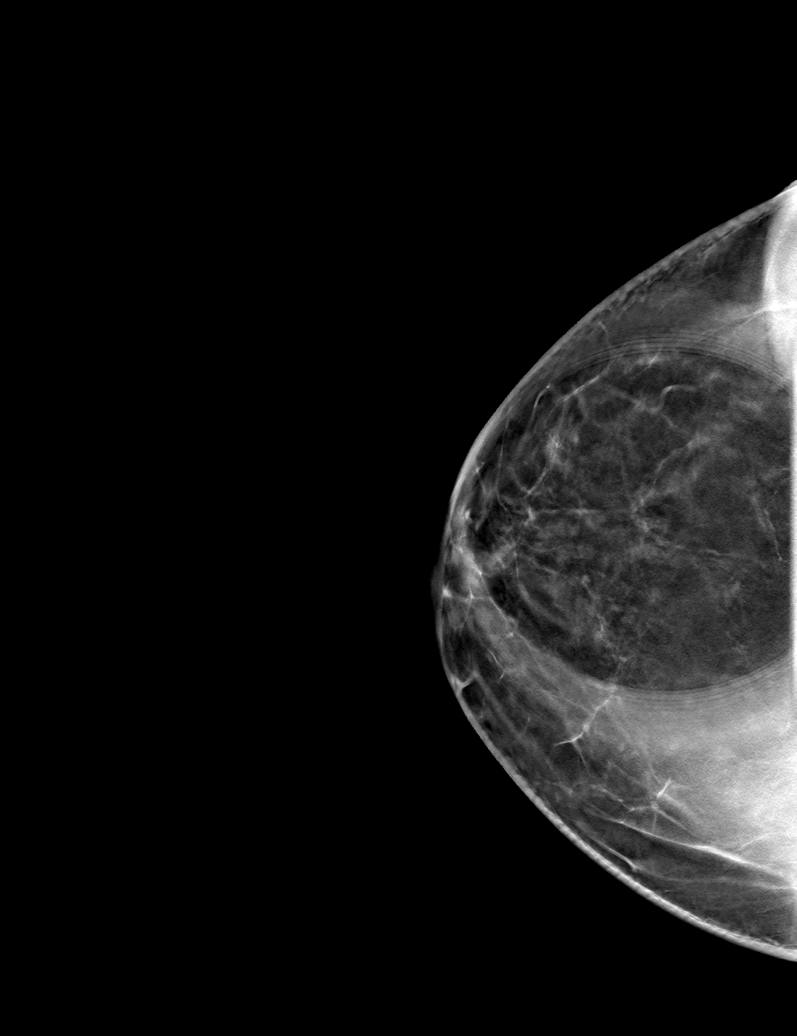

[6 of 14 positions shown; findings below may reference images not displayed]

ACR Breast Density Category b: There are scattered areas of
fibroglandular density.
FINDINGS: Standard and tomosynthesis spot-compression CC and MLO views of the
area of concern in the right breast were obtained.

The focal asymmetry disperses with compression indicating normal
fibroglandular tissue. There is no underlying mass or architectural
distortion.
IMPRESSION: No mammographic evidence of malignancy, right breast. The area of
concern on screening mammography corresponds to focal dense
fibroglandular tissue.

RECOMMENDATION:
Screening mammogram in one year.(Code:[id number])

I have discussed the findings and recommendations with the patient.
Results were also provided in writing at the conclusion of the
visit. If applicable, a reminder letter will be sent to the patient
regarding the next appointment.

BI-RADS CATEGORY  1: Negative.

## 2017-05-09 ENCOUNTER — Encounter: Payer: Self-pay | Admitting: Gynecologic Oncology

## 2017-05-09 ENCOUNTER — Ambulatory Visit: Payer: BLUE CROSS/BLUE SHIELD | Attending: Gynecologic Oncology | Admitting: Gynecologic Oncology

## 2017-05-09 VITALS — BP 150/77 | HR 78 | Temp 97.7°F | Resp 20 | Wt 182.8 lb

## 2017-05-09 DIAGNOSIS — C541 Malignant neoplasm of endometrium: Secondary | ICD-10-CM | POA: Insufficient documentation

## 2017-05-09 DIAGNOSIS — E663 Overweight: Secondary | ICD-10-CM | POA: Diagnosis not present

## 2017-05-09 DIAGNOSIS — K219 Gastro-esophageal reflux disease without esophagitis: Secondary | ICD-10-CM | POA: Diagnosis not present

## 2017-05-09 DIAGNOSIS — M797 Fibromyalgia: Secondary | ICD-10-CM | POA: Diagnosis not present

## 2017-05-09 DIAGNOSIS — Z90710 Acquired absence of both cervix and uterus: Secondary | ICD-10-CM | POA: Diagnosis not present

## 2017-05-09 DIAGNOSIS — Z8542 Personal history of malignant neoplasm of other parts of uterus: Secondary | ICD-10-CM

## 2017-05-09 DIAGNOSIS — Z6829 Body mass index (BMI) 29.0-29.9, adult: Secondary | ICD-10-CM | POA: Diagnosis not present

## 2017-05-09 DIAGNOSIS — Z9079 Acquired absence of other genital organ(s): Secondary | ICD-10-CM | POA: Diagnosis not present

## 2017-05-09 DIAGNOSIS — Z90722 Acquired absence of ovaries, bilateral: Secondary | ICD-10-CM | POA: Insufficient documentation

## 2017-05-09 DIAGNOSIS — E785 Hyperlipidemia, unspecified: Secondary | ICD-10-CM | POA: Diagnosis not present

## 2017-05-09 NOTE — Patient Instructions (Signed)
Plan to use premarin vaginal cream on Monday, Wed., and Friday.  You may increase or decrease use based on your symptoms.  Plan to follow up in one year or sooner if needed.  Please call [phone] in May or June 2019 to schedule your appointment for August 2019.

## 2017-05-09 NOTE — Progress Notes (Signed)
Endometrial Cancer Follow-up Note: Gyn-Onc  Consult was initially requested by Dr. Philis Pique for the evaluation of Janice Phillips 66 y.o. female  CC:  Chief Complaint  Patient presents with  . Endometrial cancer [facility])    Assessment/Plan:  Janice Phillips  is a 66 y.o.  year old with stage IA grade 1 endometrial adenocarcinoma. S/p robotic hysterectomy, BSO, SLN biopsy on 02/17/16, low risk factors for recurrence, therefore no adjuvant therapy is recommended according to NCCN guidelines.  Vaginal atrophy from menopause - recommend restarting vaginal premarin three times a week. I discussed that this has been shown to be safe in women with a history of low risk non-metastatic endometrial cancers.  I discussed risk for recurrence and typical symptoms encouraged her to notify us of these should they develop between visits.  I recommend she have follow-up every 6 months for 5 years in accordance with NCCN guidelines. Those visits should include symptom assessment, physical exam and pelvic examination. Pap smears are not indicated or recommended in the routine surveillance of endometrial cancer.  For follow-up she will see me in 1 month, Dr Philis Pique in 6 months and myself in 12 months.   HPI: Janice Phillips is a 66 year old parous woman who is seen in consultation at the request of Dr Philis Pique for grade 1 endometrial cancer. She has been having intermittent postmenopausal bleeding for approximately 5 years with several office biopsies that had always been benign. Most recently seh reported this symptom to Dr Philis Pique, who, on 01/13/16 took her to the operating room for a hysteroscopy and D&C. Intraoperative findings included uterine polyps. Final pathology revealed adenocarcinoma FIGO grade 1.   The patient is otherwise very healthy and has had no abdominal surgeries. She has severe postoperative nausea and vomiting and is very "sensitive" to anesthesia.  She is easily oversedated.  Her BMI is overweight  at 29kg/m2.   On 02/17/16 she underwent a robotic assisted total hysterectomy, BSO, bilateral SLN biopsy. Pathology revealed a 1.9cm endometrioid tumor with no myometrial invasion, no LVSI, no adnexal or cervical involvement and negative SLN's to ultrastaging. Her tumor was characterized as low risk for recurrence and no adjuvant therapy was recommended.  She developed a 2cm area of cuff mucosal separation (centrally). This healed with expectant management.  Interval Hx:  Denies bleeding or drainage. She does have symptoms of dyspareunia with intercourse.  Current Meds:  Outpatient Encounter Prescriptions as of 05/09/2017  Medication Sig  . calcium gluconate 500 MG tablet Take 500 mg by mouth daily.  . cholecalciferol (VITAMIN D) 1000 UNITS tablet Take 1,000 Units by mouth daily.  Marland Kitchen conjugated estrogens (PREMARIN) vaginal cream Place 1 Applicatorful vaginally 3 (three) times a week. (Patient taking differently: Place 1 Applicatorful vaginally every 14 (fourteen) days. )  . ibuprofen (ADVIL,MOTRIN) 200 MG tablet Take 400 mg by mouth every 6 (six) hours as needed for headache. Reported on 03/15/2016  . meclizine (ANTIVERT) 25 MG tablet Take 25 mg by mouth as needed (For vertigo.). Reported on 03/15/2016  . rosuvastatin (CRESTOR) 5 MG tablet Take 2.5 mg by mouth every other day.   . [DISCONTINUED] Cyanocobalamin (VITAMIN B-12 PO) Take 1 tablet by mouth daily.    No facility-administered encounter medications on file as of 05/09/2017.     Allergy:  Allergies  Allergen Reactions  . Vicodin [Hydrocodone-Acetaminophen] Nausea And Vomiting    Social Hx:   Social History   Social History  . Marital status: Married    Spouse name:  N/A  . Number of children: N/A  . Years of education: N/A   Occupational History  . Not on file.   Social History Main Topics  . Smoking status: Never Smoker  . Smokeless tobacco: Never Used  . Alcohol use No  . Drug use: No  . Sexual activity: Not on file    Other Topics Concern  . Not on file   Social History Narrative  . No narrative on file    Past Surgical Hx:  Past Surgical History:  Procedure Laterality Date  . COLONOSCOPY     x 2  . DILATION AND CURETTAGE OF UTERUS  1985  . KNEE ARTHROSCOPY  2015  . ROBOTIC ASSISTED TOTAL HYSTERECTOMY WITH BILATERAL SALPINGO OOPHERECTOMY Bilateral 02/17/2016   Procedure: XI ROBOTIC ASSISTED TOTAL HYSTERECTOMY WITH BILATERAL SALPINGO OOPHORECTOMY AND SENTAL LYMPH NODE BIOPSY;  Surgeon: Janice Amber, MD;  Location: [facility];  Service: Gynecology;  Laterality: Bilateral;    Past Medical Hx:  Past Medical History:  Diagnosis Date  . Cancer [facility]    endometrial  . Complication of anesthesia    took a week to get over "fog" of anesthesia  . Family history of adverse reaction to anesthesia    mother- n&v  . Fibromyalgia   . GERD (gastroesophageal reflux disease)    no meds currently  . Hyperlipidemia   . Low tolerance to pain medication    to all meds  . PONV (postoperative nausea and vomiting)     Past Gynecological History:  SVD x 2  No LMP recorded. Patient is postmenopausal.  Family Hx:  Family History  Problem Relation Age of Onset  . Breast cancer Sister   . Colon cancer Maternal Grandfather 53  . Stomach cancer Neg Hx     Review of Systems:  Constitutional  Feels well,    ENT Normal appearing ears and nares bilaterally Skin/Breast  No rash, sores, jaundice, itching, dryness Cardiovascular  No chest pain, shortness of breath, or edema  Pulmonary  No cough or wheeze.  Gastro Intestinal  No nausea, vomitting, or diarrhoea. No bright red blood per rectum, no abdominal pain, change in bowel movement, or constipation.  Genito Urinary  No frequency, urgency, dysuria, no bleeding or discharge. Musculo Skeletal  No myalgia, arthralgia, joint swelling or pain  Neurologic  No weakness, numbness, change in gait,  Psychology  No depression, anxiety, insomnia.   Vitals:  Blood  pressure (!) 150/77, pulse 78, temperature 97.7 F (36.5 C), temperature source Oral, resp. rate 20, weight 182 lb 12.8 oz (82.9 kg), SpO2 100 %.  Physical Exam: WD in NAD Neck  Supple NROM, without any enlargements.  Lymph Node Survey No cervical supraclavicular or inguinal adenopathy Cardiovascular  Pulse normal rate, regularity and rhythm. S1 and S2 normal.  Lungs  Clear to auscultation bilateraly, without wheezes/crackles/rhonchi. Good air movement.  Skin  No rash/lesions/breakdown  Psychiatry  Alert and oriented to person, place, and time  Abdomen  Normoactive bowel sounds, abdomen soft, non-tender and slightly overweight without evidence of hernia. Well healed incisions. Back No CVA tenderness Genito Urinary  Vulva/vagina: Normal external female genitalia.  No lesions. No discharge or bleeding.  Bladder/urethra:  No lesions or masses, well supported bladder  Vagina: well healed vagina, no lesions, no masses. Atrophic.  Adnexa: no palpable masses. Rectal  deferred Extremities  No bilateral cyanosis, clubbing or edema.   Donaciano Eva, MD  05/09/2017, 2:50 PM

## 2017-05-16 DIAGNOSIS — H18832 Recurrent erosion of cornea, left eye: Secondary | ICD-10-CM | POA: Diagnosis not present

## 2017-09-26 ENCOUNTER — Other Ambulatory Visit: Payer: Self-pay | Admitting: Gynecologic Oncology

## 2017-09-26 DIAGNOSIS — N952 Postmenopausal atrophic vaginitis: Secondary | ICD-10-CM

## 2017-09-26 MED ORDER — ESTROGENS, CONJUGATED 0.625 MG/GM VA CREA: 1.0000 | TOPICAL_CREAM | VAGINAL | 1 refills | Status: AC

## 2017-09-27 DIAGNOSIS — I10 Essential (primary) hypertension: Secondary | ICD-10-CM | POA: Diagnosis not present

## 2017-09-27 DIAGNOSIS — H9201 Otalgia, right ear: Secondary | ICD-10-CM | POA: Diagnosis not present

## 2017-09-27 DIAGNOSIS — H608X9 Other otitis externa, unspecified ear: Secondary | ICD-10-CM | POA: Diagnosis not present

## 2017-10-31 DIAGNOSIS — H903 Sensorineural hearing loss, bilateral: Secondary | ICD-10-CM | POA: Diagnosis not present

## 2017-11-18 DIAGNOSIS — Z8542 Personal history of malignant neoplasm of other parts of uterus: Secondary | ICD-10-CM | POA: Diagnosis not present

## 2017-11-18 DIAGNOSIS — Z1231 Encounter for screening mammogram for malignant neoplasm of breast: Secondary | ICD-10-CM | POA: Diagnosis not present

## 2017-11-18 DIAGNOSIS — Z6829 Body mass index (BMI) 29.0-29.9, adult: Secondary | ICD-10-CM | POA: Diagnosis not present

## 2017-11-18 DIAGNOSIS — Z01419 Encounter for gynecological examination (general) (routine) without abnormal findings: Secondary | ICD-10-CM | POA: Diagnosis not present

## 2017-11-23 DIAGNOSIS — R7309 Other abnormal glucose: Secondary | ICD-10-CM | POA: Diagnosis not present

## 2017-11-23 DIAGNOSIS — E785 Hyperlipidemia, unspecified: Secondary | ICD-10-CM | POA: Diagnosis not present

## 2017-11-23 DIAGNOSIS — N390 Urinary tract infection, site not specified: Secondary | ICD-10-CM | POA: Diagnosis not present

## 2017-11-23 DIAGNOSIS — Z0000 Encounter for general adult medical examination without abnormal findings: Secondary | ICD-10-CM | POA: Diagnosis not present

## 2017-11-23 DIAGNOSIS — E559 Vitamin D deficiency, unspecified: Secondary | ICD-10-CM | POA: Diagnosis not present

## 2017-12-08 DIAGNOSIS — Z0000 Encounter for general adult medical examination without abnormal findings: Secondary | ICD-10-CM | POA: Diagnosis not present

## 2017-12-08 DIAGNOSIS — E785 Hyperlipidemia, unspecified: Secondary | ICD-10-CM | POA: Diagnosis not present

## 2018-03-10 ENCOUNTER — Telehealth: Payer: Self-pay | Admitting: *Deleted

## 2018-03-10 NOTE — Telephone Encounter (Signed)
Patient called and scheduled the follow up appt for August

## 2018-05-17 ENCOUNTER — Inpatient Hospital Stay: Payer: BLUE CROSS/BLUE SHIELD | Attending: Gynecologic Oncology | Admitting: Gynecologic Oncology

## 2018-05-17 ENCOUNTER — Encounter: Payer: Self-pay | Admitting: Gynecologic Oncology

## 2018-05-17 VITALS — BP 150/84 | HR 78 | Temp 97.8°F | Resp 18 | Ht 66.0 in | Wt 178.1 lb

## 2018-05-17 DIAGNOSIS — Z8542 Personal history of malignant neoplasm of other parts of uterus: Secondary | ICD-10-CM | POA: Diagnosis not present

## 2018-05-17 DIAGNOSIS — Z90710 Acquired absence of both cervix and uterus: Secondary | ICD-10-CM | POA: Diagnosis not present

## 2018-05-17 DIAGNOSIS — Z08 Encounter for follow-up examination after completed treatment for malignant neoplasm: Secondary | ICD-10-CM | POA: Diagnosis not present

## 2018-05-17 DIAGNOSIS — C541 Malignant neoplasm of endometrium: Secondary | ICD-10-CM

## 2018-05-17 DIAGNOSIS — Z90722 Acquired absence of ovaries, bilateral: Secondary | ICD-10-CM | POA: Diagnosis not present

## 2018-05-17 NOTE — Progress Notes (Signed)
Endometrial Cancer Follow-up Note: Gyn-Onc  Consult was initially requested by Dr. Philis Pique for the evaluation of Janice Phillips 67 y.o. female  CC:  Chief Complaint  Patient presents with  . Endometrial cancer [facility])    Assessment/Plan:  Janice Phillips  is a 67 y.o.  year old with stage IA grade 1 endometrial adenocarcinoma. S/p robotic hysterectomy, BSO, SLN biopsy on 02/17/16, low risk factors for recurrence, therefore no adjuvant therapy is recommended according to NCCN guidelines.  I discussed risk for recurrence and typical symptoms encouraged her to notify us of these should they develop between visits.  I recommend she have follow-up every 6 months for 5 years in accordance with NCCN guidelines. Those visits should include symptom assessment, physical exam and pelvic examination. Pap smears are not indicated or recommended in the routine surveillance of endometrial cancer.  For follow-up she will see me in 1 month, Dr Philis Pique in 6 months and myself in 12 months.   HPI: Janice Phillips is a 67 year old parous woman who is seen in consultation at the request of Dr Philis Pique for grade 1 endometrial cancer. She has been having intermittent postmenopausal bleeding for approximately 5 years with several office biopsies that had always been benign. Most recently seh reported this symptom to Dr Philis Pique, who, on 01/13/16 took her to the operating room for a hysteroscopy and D&C. Intraoperative findings included uterine polyps. Final pathology revealed adenocarcinoma FIGO grade 1.   The patient is otherwise very healthy and has had no abdominal surgeries. She has severe postoperative nausea and vomiting and is very "sensitive" to anesthesia.  She is easily oversedated.  Her BMI is overweight at 29kg/m2.   On 02/17/16 she underwent a robotic assisted total hysterectomy, BSO, bilateral SLN biopsy. Pathology revealed a 1.9cm endometrioid tumor with no myometrial invasion, no LVSI, no adnexal or cervical  involvement and negative SLN's to ultrastaging. Her tumor was characterized as low risk for recurrence and no adjuvant therapy was recommended.  She developed a 2cm area of cuff mucosal separation (centrally). This healed with expectant management.  Interval Hx:  Denies bleeding or drainage. She no longer reports dyspareunia  Current Meds:  Outpatient Encounter Medications as of 05/17/2018  Medication Sig  . calcium gluconate 500 MG tablet Take 500 mg by mouth daily.  . cholecalciferol (VITAMIN D) 1000 UNITS tablet Take 1,000 Units by mouth daily.  Marland Kitchen conjugated estrogens (PREMARIN) vaginal cream Place 1 Applicatorful vaginally 3 (three) times a week.  Marland Kitchen ibuprofen (ADVIL,MOTRIN) 200 MG tablet Take 400 mg by mouth every 6 (six) hours as needed for headache. Reported on 03/15/2016  . meclizine (ANTIVERT) 25 MG tablet Take 25 mg by mouth as needed (For vertigo.). Reported on 03/15/2016  . rosuvastatin (CRESTOR) 5 MG tablet Take 2.5 mg by mouth every other day.    No facility-administered encounter medications on file as of 05/17/2018.     Allergy:  Allergies  Allergen Reactions  . Vicodin [Hydrocodone-Acetaminophen] Nausea And Vomiting    Social Hx:   Social History   Socioeconomic History  . Marital status: Married    Spouse name: Not on file  . Number of children: Not on file  . Years of education: Not on file  . Highest education level: Not on file  Occupational History  . Not on file  Social Needs  . Financial resource strain: Not on file  . Food insecurity:    Worry: Not on file    Inability: Not on file  .  Transportation needs:    Medical: Not on file    Non-medical: Not on file  Tobacco Use  . Smoking status: Never Smoker  . Smokeless tobacco: Never Used  Substance and Sexual Activity  . Alcohol use: No  . Drug use: No  . Sexual activity: Not on file  Lifestyle  . Physical activity:    Days per week: Not on file    Minutes per session: Not on file  . Stress: Not  on file  Relationships  . Social connections:    Talks on phone: Not on file    Gets together: Not on file    Attends religious service: Not on file    Active member of club or organization: Not on file    Attends meetings of clubs or organizations: Not on file    Relationship status: Not on file  . Intimate partner violence:    Fear of current or ex partner: Not on file    Emotionally abused: Not on file    Physically abused: Not on file    Forced sexual activity: Not on file  Other Topics Concern  . Not on file  Social History Narrative  . Not on file    Past Surgical Hx:  Past Surgical History:  Procedure Laterality Date  . COLONOSCOPY     x 2  . DILATION AND CURETTAGE OF UTERUS  1985  . KNEE ARTHROSCOPY  2015  . ROBOTIC ASSISTED TOTAL HYSTERECTOMY WITH BILATERAL SALPINGO OOPHERECTOMY Bilateral 02/17/2016   Procedure: XI ROBOTIC ASSISTED TOTAL HYSTERECTOMY WITH BILATERAL SALPINGO OOPHORECTOMY AND SENTAL LYMPH NODE BIOPSY;  Surgeon: Everitt Amber, MD;  Location: [facility];  Service: Gynecology;  Laterality: Bilateral;    Past Medical Hx:  Past Medical History:  Diagnosis Date  . Cancer [facility])    endometrial  . Complication of anesthesia    took a week to get over "fog" of anesthesia  . Family history of adverse reaction to anesthesia    mother- n&v  . Fibromyalgia   . GERD (gastroesophageal reflux disease)    no meds currently  . Hyperlipidemia   . Low tolerance to pain medication    to all meds  . PONV (postoperative nausea and vomiting)     Past Gynecological History:  SVD x 2  No LMP recorded. Patient is postmenopausal.  Family Hx:  Family History  Problem Relation Age of Onset  . Breast cancer Sister   . Colon cancer Maternal Grandfather 52  . Stomach cancer Neg Hx     Review of Systems:  Constitutional  Feels well,    ENT Normal appearing ears and nares bilaterally Skin/Breast  No rash, sores, jaundice, itching, dryness Cardiovascular  No chest pain,  shortness of breath, or edema  Pulmonary  No cough or wheeze.  Gastro Intestinal  No nausea, vomitting, or diarrhoea. No bright red blood per rectum, no abdominal pain, change in bowel movement, or constipation.  Genito Urinary  No frequency, urgency, dysuria, no bleeding or discharge. Musculo Skeletal  No myalgia, arthralgia, joint swelling or pain  Neurologic  No weakness, numbness, change in gait,  Psychology  No depression, anxiety, insomnia.   Vitals:  Blood pressure (!) 150/84, pulse 78, temperature 97.8 F (36.6 C), temperature source Oral, resp. rate 18, height 5\' 6"  (1.676 m), weight 178 lb 1.6 oz (80.8 kg), SpO2 100 %.  Physical Exam: WD in NAD Neck  Supple NROM, without any enlargements.  Lymph Node Survey No cervical supraclavicular or inguinal adenopathy  Cardiovascular  Pulse normal rate, regularity and rhythm. S1 and S2 normal.  Lungs  Clear to auscultation bilateraly, without wheezes/crackles/rhonchi. Good air movement.  Skin  No rash/lesions/breakdown  Psychiatry  Alert and oriented to person, place, and time  Abdomen  Normoactive bowel sounds, abdomen soft, non-tender and slightly overweight without evidence of hernia. Well healed incisions. Back No CVA tenderness Genito Urinary  Vulva/vagina: blackish blue vessels (petechia) on left labia majora and anterior periclitoral labia consistent with varicoscities.  Bladder/urethra:  No lesions or masses, well supported bladder  Vagina: well healed vagina, no lesions, no masses. Atrophic.  Adnexa: no palpable masses. Rectal  deferred Extremities  No bilateral cyanosis, clubbing or edema.   Thereasa Solo, MD  05/17/2018, 12:27 PM

## 2018-05-17 NOTE — Patient Instructions (Signed)
Please notify Dr Denman George at phone number [phone] if you notice vaginal bleeding, new pelvic or abdominal pains, bloating, feeling full easy, or a change in bladder or bowel function.   Please return to see Dr Denman George in August of 2020 (you can contact her office at the above number in the spring of 2020 to schedule this). Please return to see Dr Philis Pique in the early part of 2020.

## 2018-06-05 DIAGNOSIS — E785 Hyperlipidemia, unspecified: Secondary | ICD-10-CM | POA: Diagnosis not present

## 2018-06-05 DIAGNOSIS — R7309 Other abnormal glucose: Secondary | ICD-10-CM | POA: Diagnosis not present

## 2018-06-08 DIAGNOSIS — E785 Hyperlipidemia, unspecified: Secondary | ICD-10-CM | POA: Diagnosis not present

## 2018-06-08 DIAGNOSIS — K219 Gastro-esophageal reflux disease without esophagitis: Secondary | ICD-10-CM | POA: Diagnosis not present

## 2018-06-08 DIAGNOSIS — E559 Vitamin D deficiency, unspecified: Secondary | ICD-10-CM | POA: Diagnosis not present

## 2018-06-08 DIAGNOSIS — E7800 Pure hypercholesterolemia, unspecified: Secondary | ICD-10-CM | POA: Diagnosis not present

## 2018-06-15 DIAGNOSIS — H5213 Myopia, bilateral: Secondary | ICD-10-CM | POA: Diagnosis not present

## 2018-06-15 DIAGNOSIS — H18832 Recurrent erosion of cornea, left eye: Secondary | ICD-10-CM | POA: Diagnosis not present

## 2018-06-15 DIAGNOSIS — H2513 Age-related nuclear cataract, bilateral: Secondary | ICD-10-CM | POA: Diagnosis not present

## 2018-06-15 DIAGNOSIS — H524 Presbyopia: Secondary | ICD-10-CM | POA: Diagnosis not present

## 2018-06-16 DIAGNOSIS — Z23 Encounter for immunization: Secondary | ICD-10-CM | POA: Diagnosis not present

## 2018-12-11 DIAGNOSIS — E559 Vitamin D deficiency, unspecified: Secondary | ICD-10-CM | POA: Diagnosis not present

## 2018-12-11 DIAGNOSIS — R7309 Other abnormal glucose: Secondary | ICD-10-CM | POA: Diagnosis not present

## 2018-12-11 DIAGNOSIS — Z0001 Encounter for general adult medical examination with abnormal findings: Secondary | ICD-10-CM | POA: Diagnosis not present

## 2018-12-11 DIAGNOSIS — Z0000 Encounter for general adult medical examination without abnormal findings: Secondary | ICD-10-CM | POA: Diagnosis not present

## 2018-12-21 DIAGNOSIS — L239 Allergic contact dermatitis, unspecified cause: Secondary | ICD-10-CM | POA: Diagnosis not present

## 2018-12-21 DIAGNOSIS — L821 Other seborrheic keratosis: Secondary | ICD-10-CM | POA: Diagnosis not present

## 2019-01-03 DIAGNOSIS — M79644 Pain in right finger(s): Secondary | ICD-10-CM | POA: Diagnosis not present

## 2019-01-03 DIAGNOSIS — Z0000 Encounter for general adult medical examination without abnormal findings: Secondary | ICD-10-CM | POA: Diagnosis not present

## 2019-01-09 DIAGNOSIS — M659 Synovitis and tenosynovitis, unspecified: Secondary | ICD-10-CM | POA: Insufficient documentation

## 2019-01-09 DIAGNOSIS — M65841 Other synovitis and tenosynovitis, right hand: Secondary | ICD-10-CM | POA: Diagnosis not present

## 2019-01-09 DIAGNOSIS — M79644 Pain in right finger(s): Secondary | ICD-10-CM | POA: Diagnosis not present

## 2019-01-09 DIAGNOSIS — M6580 Other synovitis and tenosynovitis, unspecified site: Secondary | ICD-10-CM | POA: Insufficient documentation

## 2019-04-06 DIAGNOSIS — Z6829 Body mass index (BMI) 29.0-29.9, adult: Secondary | ICD-10-CM | POA: Diagnosis not present

## 2019-04-06 DIAGNOSIS — Z1231 Encounter for screening mammogram for malignant neoplasm of breast: Secondary | ICD-10-CM | POA: Diagnosis not present

## 2019-04-06 DIAGNOSIS — Z01419 Encounter for gynecological examination (general) (routine) without abnormal findings: Secondary | ICD-10-CM | POA: Diagnosis not present

## 2019-06-21 DIAGNOSIS — Z23 Encounter for immunization: Secondary | ICD-10-CM | POA: Diagnosis not present

## 2019-06-28 DIAGNOSIS — H18832 Recurrent erosion of cornea, left eye: Secondary | ICD-10-CM | POA: Diagnosis not present

## 2019-06-28 DIAGNOSIS — H524 Presbyopia: Secondary | ICD-10-CM | POA: Diagnosis not present

## 2019-06-28 DIAGNOSIS — H2513 Age-related nuclear cataract, bilateral: Secondary | ICD-10-CM | POA: Diagnosis not present

## 2019-06-28 DIAGNOSIS — H5213 Myopia, bilateral: Secondary | ICD-10-CM | POA: Diagnosis not present

## 2019-07-12 DIAGNOSIS — E559 Vitamin D deficiency, unspecified: Secondary | ICD-10-CM | POA: Diagnosis not present

## 2019-07-12 DIAGNOSIS — E538 Deficiency of other specified B group vitamins: Secondary | ICD-10-CM | POA: Diagnosis not present

## 2019-07-12 DIAGNOSIS — R7309 Other abnormal glucose: Secondary | ICD-10-CM | POA: Diagnosis not present

## 2019-07-12 DIAGNOSIS — E785 Hyperlipidemia, unspecified: Secondary | ICD-10-CM | POA: Diagnosis not present

## 2019-07-27 DIAGNOSIS — Z20828 Contact with and (suspected) exposure to other viral communicable diseases: Secondary | ICD-10-CM | POA: Diagnosis not present

## 2019-09-24 DIAGNOSIS — K219 Gastro-esophageal reflux disease without esophagitis: Secondary | ICD-10-CM | POA: Diagnosis not present

## 2019-09-24 DIAGNOSIS — R05 Cough: Secondary | ICD-10-CM | POA: Diagnosis not present

## 2019-09-24 DIAGNOSIS — E785 Hyperlipidemia, unspecified: Secondary | ICD-10-CM | POA: Diagnosis not present

## 2019-09-24 DIAGNOSIS — J309 Allergic rhinitis, unspecified: Secondary | ICD-10-CM | POA: Diagnosis not present

## 2019-10-01 ENCOUNTER — Telehealth: Payer: Self-pay | Admitting: *Deleted

## 2019-10-01 NOTE — Telephone Encounter (Signed)
Patient scheduled a follow up for February

## 2019-11-13 ENCOUNTER — Inpatient Hospital Stay: Payer: BC Managed Care – PPO | Attending: Gynecologic Oncology | Admitting: Gynecologic Oncology

## 2019-11-13 ENCOUNTER — Other Ambulatory Visit: Payer: Self-pay

## 2019-11-13 ENCOUNTER — Encounter: Payer: Self-pay | Admitting: Gynecologic Oncology

## 2019-11-13 VITALS — BP 149/81 | HR 102 | Temp 98.2°F | Resp 17 | Ht 66.0 in | Wt 187.2 lb

## 2019-11-13 DIAGNOSIS — Z79899 Other long term (current) drug therapy: Secondary | ICD-10-CM | POA: Diagnosis not present

## 2019-11-13 DIAGNOSIS — C541 Malignant neoplasm of endometrium: Secondary | ICD-10-CM | POA: Diagnosis not present

## 2019-11-13 DIAGNOSIS — Z90710 Acquired absence of both cervix and uterus: Secondary | ICD-10-CM | POA: Insufficient documentation

## 2019-11-13 DIAGNOSIS — Z90722 Acquired absence of ovaries, bilateral: Secondary | ICD-10-CM | POA: Insufficient documentation

## 2019-11-13 DIAGNOSIS — E785 Hyperlipidemia, unspecified: Secondary | ICD-10-CM | POA: Diagnosis not present

## 2019-11-13 DIAGNOSIS — K219 Gastro-esophageal reflux disease without esophagitis: Secondary | ICD-10-CM | POA: Diagnosis not present

## 2019-11-13 DIAGNOSIS — M797 Fibromyalgia: Secondary | ICD-10-CM | POA: Insufficient documentation

## 2019-11-13 NOTE — Patient Instructions (Signed)
Please notify Dr Denman George at phone number [phone] if you notice vaginal bleeding, new pelvic or abdominal pains, bloating, feeling full easy, or a change in bladder or bowel function.   Please contact Dr Serita Grit office (at [phone]) in October, 2021 to request an appointment with her for February, 2022.  It is reasonable to stop using the premarin cream and try an over the counter cream for vaginal dryness.

## 2019-11-13 NOTE — Progress Notes (Signed)
Endometrial Cancer Follow-up Note: Gyn-Onc  Consult was initially requested by Dr. Philis Pique for the evaluation of Janice Phillips 69 y.o. female  CC:  Chief Complaint  Patient presents with  . endometrial cancer    Assessment/Plan:  [name]  is a 69 y.o.  year old with stage IA grade 1 endometrial adenocarcinoma. S/p robotic hysterectomy, BSO, SLN biopsy on 02/17/16, low risk factors for recurrence, therefore no adjuvant therapy is recommended according to NCCN guidelines.  I discussed risk for recurrence and typical symptoms encouraged her to notify us of these should they develop between visits.  I recommend she have follow-up every 6 months for 5 years in accordance with NCCN guidelines. Those visits should include symptom assessment, physical exam and pelvic examination. Pap smears are not indicated or recommended in the routine surveillance of endometrial cancer.  For follow-up she will see me in 1 month, Dr Philis Pique in 6 months and myself in 12 months.   HPI: Janice Phillips is a 69 year old parous woman who is seen in consultation at the request of Dr Philis Pique for grade 1 endometrial cancer. She has been having intermittent postmenopausal bleeding for approximately 5 years with several office biopsies that had always been benign. Most recently seh reported this symptom to Dr Philis Pique, who, on 01/13/16 took her to the operating room for a hysteroscopy and D&C. Intraoperative findings included uterine polyps. Final pathology revealed adenocarcinoma FIGO grade 1.   The patient is otherwise very healthy and has had no abdominal surgeries. She has severe postoperative nausea and vomiting and is very "sensitive" to anesthesia.  She is easily oversedated.  Her BMI is overweight at 29kg/m2.   On 02/17/16 she underwent a robotic assisted total hysterectomy, BSO, bilateral SLN biopsy. Pathology revealed a 1.9cm endometrioid tumor with no myometrial invasion, no LVSI, no adnexal or cervical  involvement and negative SLN's to ultrastaging. Her tumor was characterized as low risk for recurrence and no adjuvant therapy was recommended.  She developed a 2cm area of cuff mucosal separation (centrally). This healed with expectant management.  Interval Hx:   Denies bleeding or drainage. She would prefer to stop using the premarin now due to expense.   Current Meds:  Outpatient Encounter Medications as of 11/13/2019  Medication Sig  . calcium gluconate 500 MG tablet Take 500 mg by mouth daily.  . cholecalciferol (VITAMIN D) 1000 UNITS tablet Take 1,000 Units by mouth daily.  Marland Kitchen conjugated estrogens (PREMARIN) vaginal cream Place 1 Applicatorful vaginally 3 (three) times a week.  Marland Kitchen ibuprofen (ADVIL,MOTRIN) 200 MG tablet Take 400 mg by mouth every 6 (six) hours as needed for headache. Reported on 03/15/2016  . meclizine (ANTIVERT) 25 MG tablet Take 25 mg by mouth as needed (For vertigo.). Reported on 03/15/2016  . rosuvastatin (CRESTOR) 5 MG tablet Take 2.5 mg by mouth every other day.    No facility-administered encounter medications on file as of 11/13/2019.    Allergy:  Allergies  Allergen Reactions  . Vicodin [Hydrocodone-Acetaminophen] Nausea And Vomiting    Social Hx:   Social History   Socioeconomic History  . Marital status: Married    Spouse name: Not on file  . Number of children: Not on file  . Years of education: Not on file  . Highest education level: Not on file  Occupational History  . Not on file  Tobacco Use  . Smoking status: Never Smoker  . Smokeless tobacco: Never Used  Substance and Sexual Activity  .  Alcohol use: No  . Drug use: No  . Sexual activity: Not on file  Other Topics Concern  . Not on file  Social History Narrative  . Not on file   Social Determinants of Health   Financial Resource Strain:   . Difficulty of Paying Living Expenses: Not on file  Food Insecurity:   . Worried About Charity fundraiser in the Last Year: Not on file  .  Ran Out of Food in the Last Year: Not on file  Transportation Needs:   . Lack of Transportation (Medical): Not on file  . Lack of Transportation (Non-Medical): Not on file  Physical Activity:   . Days of Exercise per Week: Not on file  . Minutes of Exercise per Session: Not on file  Stress:   . Feeling of Stress : Not on file  Social Connections:   . Frequency of Communication with Friends and Family: Not on file  . Frequency of Social Gatherings with Friends and Family: Not on file  . Attends Religious Services: Not on file  . Active Member of Clubs or Organizations: Not on file  . Attends Archivist Meetings: Not on file  . Marital Status: Not on file  Intimate Partner Violence:   . Fear of Current or Ex-Partner: Not on file  . Emotionally Abused: Not on file  . Physically Abused: Not on file  . Sexually Abused: Not on file    Past Surgical Hx:  Past Surgical History:  Procedure Laterality Date  . COLONOSCOPY     x 2  . DILATION AND CURETTAGE OF UTERUS  1985  . KNEE ARTHROSCOPY  2015  . ROBOTIC ASSISTED TOTAL HYSTERECTOMY WITH BILATERAL SALPINGO OOPHERECTOMY Bilateral 02/17/2016   Procedure: XI ROBOTIC ASSISTED TOTAL HYSTERECTOMY WITH BILATERAL SALPINGO OOPHORECTOMY AND SENTAL LYMPH NODE BIOPSY;  Surgeon: Everitt Amber, MD;  Location: [facility];  Service: Gynecology;  Laterality: Bilateral;    Past Medical Hx:  Past Medical History:  Diagnosis Date  . Cancer [facility])    endometrial  . Complication of anesthesia    took a week to get over "fog" of anesthesia  . Family history of adverse reaction to anesthesia    mother- n&v  . Fibromyalgia   . GERD (gastroesophageal reflux disease)    no meds currently  . Hyperlipidemia   . Low tolerance to pain medication    to all meds  . PONV (postoperative nausea and vomiting)     Past Gynecological History:  SVD x 2  No LMP recorded. Patient is postmenopausal.  Family Hx:  Family History  Problem Relation Age of Onset  .  Breast cancer Sister   . Colon cancer Maternal Grandfather 31  . Stomach cancer Neg Hx     Review of Systems:  Constitutional  Feels well,    ENT Normal appearing ears and nares bilaterally Skin/Breast  No rash, sores, jaundice, itching, dryness Cardiovascular  No chest pain, shortness of breath, or edema  Pulmonary  No cough or wheeze.  Gastro Intestinal  No nausea, vomitting, or diarrhoea. No bright red blood per rectum, no abdominal pain, change in bowel movement, or constipation.  Genito Urinary  No frequency, urgency, dysuria, no bleeding or discharge. Musculo Skeletal  No myalgia, arthralgia, joint swelling or pain  Neurologic  No weakness, numbness, change in gait,  Psychology  No depression, anxiety, insomnia.   Vitals:  There were no vitals taken for this visit.  Physical Exam: WD in NAD Neck  Supple NROM, without any enlargements.  Lymph Node Survey No cervical supraclavicular or inguinal adenopathy Cardiovascular  Pulse normal rate, regularity and rhythm. S1 and S2 normal.  Lungs  Clear to auscultation bilateraly, without wheezes/crackles/rhonchi. Good air movement.  Skin  No rash/lesions/breakdown  Psychiatry  Alert and oriented to person, place, and time  Abdomen  Normoactive bowel sounds, abdomen soft, non-tender and slightly overweight without evidence of hernia. Well healed incisions. Back No CVA tenderness Genito Urinary  Vulva/vagina: blackish blue vessels (petechia) on left labia majora and anterior periclitoral labia consistent with varicoscities.  Bladder/urethra:  No lesions or masses, well supported bladder  Vagina: well healed vagina, no lesions, no masses. Atrophic.  Adnexa: no palpable masses. Rectal  deferred Extremities  No bilateral cyanosis, clubbing or edema.   Thereasa Solo, MD  11/13/2019, 2:02 PM

## 2020-01-03 DIAGNOSIS — E785 Hyperlipidemia, unspecified: Secondary | ICD-10-CM | POA: Diagnosis not present

## 2020-01-03 DIAGNOSIS — E559 Vitamin D deficiency, unspecified: Secondary | ICD-10-CM | POA: Diagnosis not present

## 2020-01-03 DIAGNOSIS — R7309 Other abnormal glucose: Secondary | ICD-10-CM | POA: Diagnosis not present

## 2020-01-03 DIAGNOSIS — E538 Deficiency of other specified B group vitamins: Secondary | ICD-10-CM | POA: Diagnosis not present

## 2020-01-10 DIAGNOSIS — Z0000 Encounter for general adult medical examination without abnormal findings: Secondary | ICD-10-CM | POA: Diagnosis not present

## 2020-01-10 DIAGNOSIS — E785 Hyperlipidemia, unspecified: Secondary | ICD-10-CM | POA: Diagnosis not present

## 2020-01-10 DIAGNOSIS — Z23 Encounter for immunization: Secondary | ICD-10-CM | POA: Diagnosis not present

## 2020-06-19 DIAGNOSIS — Z23 Encounter for immunization: Secondary | ICD-10-CM | POA: Diagnosis not present

## 2020-06-25 DIAGNOSIS — N952 Postmenopausal atrophic vaginitis: Secondary | ICD-10-CM | POA: Diagnosis not present

## 2020-06-25 DIAGNOSIS — Z1231 Encounter for screening mammogram for malignant neoplasm of breast: Secondary | ICD-10-CM | POA: Diagnosis not present

## 2020-06-25 DIAGNOSIS — Z01419 Encounter for gynecological examination (general) (routine) without abnormal findings: Secondary | ICD-10-CM | POA: Diagnosis not present

## 2020-07-10 DIAGNOSIS — E559 Vitamin D deficiency, unspecified: Secondary | ICD-10-CM | POA: Diagnosis not present

## 2020-07-10 DIAGNOSIS — E785 Hyperlipidemia, unspecified: Secondary | ICD-10-CM | POA: Diagnosis not present

## 2020-07-10 DIAGNOSIS — R7309 Other abnormal glucose: Secondary | ICD-10-CM | POA: Diagnosis not present

## 2020-12-08 ENCOUNTER — Telehealth: Payer: Self-pay | Admitting: *Deleted

## 2020-12-08 NOTE — Telephone Encounter (Signed)
Patient called and scheduled a follow up appt for 4/4 at 2:30 pm

## 2020-12-09 DIAGNOSIS — L918 Other hypertrophic disorders of the skin: Secondary | ICD-10-CM | POA: Diagnosis not present

## 2020-12-09 DIAGNOSIS — D2261 Melanocytic nevi of right upper limb, including shoulder: Secondary | ICD-10-CM | POA: Diagnosis not present

## 2020-12-09 DIAGNOSIS — D1801 Hemangioma of skin and subcutaneous tissue: Secondary | ICD-10-CM | POA: Diagnosis not present

## 2020-12-09 DIAGNOSIS — D2272 Melanocytic nevi of left lower limb, including hip: Secondary | ICD-10-CM | POA: Diagnosis not present

## 2020-12-09 DIAGNOSIS — L570 Actinic keratosis: Secondary | ICD-10-CM | POA: Diagnosis not present

## 2020-12-19 ENCOUNTER — Encounter: Payer: Self-pay | Admitting: Gynecologic Oncology

## 2020-12-22 ENCOUNTER — Telehealth: Payer: Self-pay

## 2020-12-22 ENCOUNTER — Inpatient Hospital Stay: Payer: BC Managed Care – PPO | Attending: Gynecologic Oncology | Admitting: Gynecologic Oncology

## 2020-12-22 ENCOUNTER — Other Ambulatory Visit: Payer: Self-pay

## 2020-12-22 ENCOUNTER — Encounter: Payer: Self-pay | Admitting: Gynecologic Oncology

## 2020-12-22 VITALS — BP 162/89 | HR 89 | Resp 18 | Wt 182.1 lb

## 2020-12-22 DIAGNOSIS — M797 Fibromyalgia: Secondary | ICD-10-CM | POA: Diagnosis not present

## 2020-12-22 DIAGNOSIS — Z79899 Other long term (current) drug therapy: Secondary | ICD-10-CM | POA: Insufficient documentation

## 2020-12-22 DIAGNOSIS — E785 Hyperlipidemia, unspecified: Secondary | ICD-10-CM | POA: Insufficient documentation

## 2020-12-22 DIAGNOSIS — Z90710 Acquired absence of both cervix and uterus: Secondary | ICD-10-CM | POA: Diagnosis not present

## 2020-12-22 DIAGNOSIS — Z90722 Acquired absence of ovaries, bilateral: Secondary | ICD-10-CM | POA: Diagnosis not present

## 2020-12-22 DIAGNOSIS — C541 Malignant neoplasm of endometrium: Secondary | ICD-10-CM | POA: Insufficient documentation

## 2020-12-22 DIAGNOSIS — K219 Gastro-esophageal reflux disease without esophagitis: Secondary | ICD-10-CM | POA: Diagnosis not present

## 2020-12-22 NOTE — Progress Notes (Signed)
Endometrial Cancer Follow-up Note: Gyn-Onc  Consult was initially requested by Dr. Philis Pique for the evaluation of Janice Phillips 70 y.o. female  CC:  Chief Complaint  Patient presents with  . Endometrial cancer [facility])    Assessment/Plan:  Janice Phillips  is a 70 y.o.  year old with stage IA grade 1 endometrial adenocarcinoma. S/p robotic hysterectomy, BSO, SLN biopsy on 02/17/16, low risk factors for recurrence, therefore no adjuvant therapy is recommended according to NCCN guidelines.  I discussed risk for recurrence and typical symptoms encouraged her to notify us of these should they develop between visits.  She has completed cancer specific scheduled surveillance, and can continue annual wellness exams with Dr Philis Pique, returning to see me on a prn basis.   HPI: Janice Phillips is a 70 year old parous woman who is seen in consultation at the request of Dr Philis Pique for grade 1 endometrial cancer. She has been having intermittent postmenopausal bleeding for approximately 5 years with several office biopsies that had always been benign. Most recently seh reported this symptom to Dr Philis Pique, who, on 01/13/16 took her to the operating room for a hysteroscopy and D&C. Intraoperative findings included uterine polyps. Final pathology revealed adenocarcinoma FIGO grade 1.   On 02/17/16 she underwent a robotic assisted total hysterectomy, BSO, bilateral SLN biopsy. Pathology revealed a 1.9cm endometrioid tumor with no myometrial invasion, no LVSI, no adnexal or cervical involvement and negative SLN's to ultrastaging. Her tumor was characterized as low risk for recurrence and no adjuvant therapy was recommended.  She developed a 2cm area of cuff mucosal separation (centrally). This healed with expectant management.  Interval Hx:   She had no symptoms of recurrence.   Current Meds:  Outpatient Encounter Medications as of 12/22/2020  Medication Sig  . calcium gluconate 500 MG tablet Take 500 mg by mouth  daily.  . cholecalciferol (VITAMIN D) 1000 UNITS tablet Take 1,000 Units by mouth daily.  Marland Kitchen conjugated estrogens (PREMARIN) vaginal cream Place 1 Applicatorful vaginally 3 (three) times a week.  Marland Kitchen omeprazole (PRILOSEC) 20 MG capsule Take 20 mg by mouth daily.  . rosuvastatin (CRESTOR) 5 MG tablet Take 2.5 mg by mouth every other day.   . ibuprofen (ADVIL,MOTRIN) 200 MG tablet Take 400 mg by mouth every 6 (six) hours as needed for headache. Reported on 03/15/2016 (Patient not taking: Reported on 12/19/2020)  . meclizine (ANTIVERT) 25 MG tablet Take 25 mg by mouth as needed (For vertigo.). Reported on 03/15/2016 (Patient not taking: Reported on 12/19/2020)   No facility-administered encounter medications on file as of 12/22/2020.    Allergy:  Allergies  Allergen Reactions  . Chlorhexidine Hives  . Atorvastatin Other (See Comments)    Muscle Aches  . Vicodin [Hydrocodone-Acetaminophen] Nausea And Vomiting    Social Hx:   Social History   Socioeconomic History  . Marital status: Married    Spouse name: Not on file  . Number of children: Not on file  . Years of education: Not on file  . Highest education level: Not on file  Occupational History  . Not on file  Tobacco Use  . Smoking status: Never Smoker  . Smokeless tobacco: Never Used  Vaping Use  . Vaping Use: Never used  Substance and Sexual Activity  . Alcohol use: No  . Drug use: No  . Sexual activity: Not on file  Other Topics Concern  . Not on file  Social History Narrative  . Not on file   Social  Determinants of Health   Financial Resource Strain: Not on file  Food Insecurity: Not on file  Transportation Needs: Not on file  Physical Activity: Not on file  Stress: Not on file  Social Connections: Not on file  Intimate Partner Violence: Not on file    Past Surgical Hx:  Past Surgical History:  Procedure Laterality Date  . COLONOSCOPY     x 2  . DILATION AND CURETTAGE OF UTERUS  1985  . KNEE ARTHROSCOPY  2015  .  ROBOTIC ASSISTED TOTAL HYSTERECTOMY WITH BILATERAL SALPINGO OOPHERECTOMY Bilateral 02/17/2016   Procedure: XI ROBOTIC ASSISTED TOTAL HYSTERECTOMY WITH BILATERAL SALPINGO OOPHORECTOMY AND SENTAL LYMPH NODE BIOPSY;  Surgeon: Everitt Amber, MD;  Location: [facility];  Service: Gynecology;  Laterality: Bilateral;    Past Medical Hx:  Past Medical History:  Diagnosis Date  . Cancer [facility])    endometrial  . Complication of anesthesia    took a week to get over "fog" of anesthesia  . Family history of adverse reaction to anesthesia    mother- n&v  . Fibromyalgia   . GERD (gastroesophageal reflux disease)    no meds currently  . Hyperlipidemia   . Low tolerance to pain medication    to all meds  . PONV (postoperative nausea and vomiting)     Past Gynecological History:  SVD x 2  No LMP recorded. Patient is postmenopausal.  Family Hx:  Family History  Problem Relation Age of Onset  . Breast cancer Sister   . Colon cancer Maternal Grandfather 31  . Congestive Heart Failure Mother   . Stomach cancer Neg Hx     Review of Systems:  Constitutional  Feels well,    ENT Normal appearing ears and nares bilaterally Skin/Breast  No rash, sores, jaundice, itching, dryness Cardiovascular  No chest pain, shortness of breath, or edema  Pulmonary  No cough or wheeze.  Gastro Intestinal  No nausea, vomitting, or diarrhoea. No bright red blood per rectum, no abdominal pain, change in bowel movement, or constipation.  Genito Urinary  No frequency, urgency, dysuria, no bleeding or discharge. Musculo Skeletal  No myalgia, arthralgia, joint swelling or pain  Neurologic  No weakness, numbness, change in gait,  Psychology  No depression, anxiety, insomnia.   Vitals:  Blood pressure (!) 162/89, pulse 89, resp. rate 18, weight 182 lb 1.6 oz (82.6 kg), SpO2 100 %.  Physical Exam: WD in NAD Neck  Supple NROM, without any enlargements.  Lymph Node Survey No cervical supraclavicular or inguinal  adenopathy Cardiovascular  Pulse normal rate, regularity and rhythm. S1 and S2 normal.  Lungs  Clear to auscultation bilateraly, without wheezes/crackles/rhonchi. Good air movement.  Skin  No rash/lesions/breakdown  Psychiatry  Alert and oriented to person, place, and time  Abdomen  Normoactive bowel sounds, abdomen soft, non-tender and slightly overweight without evidence of hernia. Well healed incisions. Back No CVA tenderness Genito Urinary  Vulva/vagina: blackish blue vessels (petechia) on left labia majora and anterior periclitoral labia consistent with varicoscities.  Bladder/urethra:  No lesions or masses, well supported bladder  Vagina: well healed vagina, no lesions, no masses. Atrophic.  Adnexa: no palpable masses. Rectal  deferred Extremities  No bilateral cyanosis, clubbing or edema.   Janice Solo, MD  12/22/2020, 2:47 PM

## 2020-12-22 NOTE — Patient Instructions (Signed)
You have completed 5 years of follow-up after you cancer diagnosis.  You no longer require scheduled surveillance visits. However, if you develop new concerning symptoms, please reach out to Dr Denman George at [phone].

## 2020-12-22 NOTE — Telephone Encounter (Signed)
ENCOUNTER OPENED IN ERROR

## 2020-12-23 DIAGNOSIS — L918 Other hypertrophic disorders of the skin: Secondary | ICD-10-CM | POA: Diagnosis not present

## 2021-01-07 DIAGNOSIS — E559 Vitamin D deficiency, unspecified: Secondary | ICD-10-CM | POA: Diagnosis not present

## 2021-01-07 DIAGNOSIS — E785 Hyperlipidemia, unspecified: Secondary | ICD-10-CM | POA: Diagnosis not present

## 2021-01-07 DIAGNOSIS — R7309 Other abnormal glucose: Secondary | ICD-10-CM | POA: Diagnosis not present

## 2021-01-07 DIAGNOSIS — Z0000 Encounter for general adult medical examination without abnormal findings: Secondary | ICD-10-CM | POA: Diagnosis not present

## 2021-01-12 DIAGNOSIS — Z23 Encounter for immunization: Secondary | ICD-10-CM | POA: Diagnosis not present

## 2021-01-12 DIAGNOSIS — E785 Hyperlipidemia, unspecified: Secondary | ICD-10-CM | POA: Diagnosis not present

## 2021-01-12 DIAGNOSIS — E559 Vitamin D deficiency, unspecified: Secondary | ICD-10-CM | POA: Diagnosis not present

## 2021-01-12 DIAGNOSIS — R7309 Other abnormal glucose: Secondary | ICD-10-CM | POA: Diagnosis not present

## 2021-01-12 DIAGNOSIS — Z0000 Encounter for general adult medical examination without abnormal findings: Secondary | ICD-10-CM | POA: Diagnosis not present

## 2021-04-13 DIAGNOSIS — H40053 Ocular hypertension, bilateral: Secondary | ICD-10-CM | POA: Diagnosis not present

## 2021-04-13 DIAGNOSIS — H2513 Age-related nuclear cataract, bilateral: Secondary | ICD-10-CM | POA: Diagnosis not present

## 2021-04-13 DIAGNOSIS — H5213 Myopia, bilateral: Secondary | ICD-10-CM | POA: Diagnosis not present

## 2021-05-14 DIAGNOSIS — H40053 Ocular hypertension, bilateral: Secondary | ICD-10-CM | POA: Diagnosis not present

## 2021-07-07 DIAGNOSIS — E785 Hyperlipidemia, unspecified: Secondary | ICD-10-CM | POA: Diagnosis not present

## 2021-07-07 DIAGNOSIS — R7309 Other abnormal glucose: Secondary | ICD-10-CM | POA: Diagnosis not present

## 2021-07-09 DIAGNOSIS — Z1231 Encounter for screening mammogram for malignant neoplasm of breast: Secondary | ICD-10-CM | POA: Diagnosis not present

## 2021-07-09 DIAGNOSIS — Z01419 Encounter for gynecological examination (general) (routine) without abnormal findings: Secondary | ICD-10-CM | POA: Diagnosis not present

## 2021-07-14 DIAGNOSIS — E785 Hyperlipidemia, unspecified: Secondary | ICD-10-CM | POA: Diagnosis not present

## 2021-07-14 DIAGNOSIS — R7309 Other abnormal glucose: Secondary | ICD-10-CM | POA: Diagnosis not present

## 2021-07-14 DIAGNOSIS — E559 Vitamin D deficiency, unspecified: Secondary | ICD-10-CM | POA: Diagnosis not present

## 2021-11-19 DIAGNOSIS — R109 Unspecified abdominal pain: Secondary | ICD-10-CM | POA: Diagnosis not present

## 2021-11-19 DIAGNOSIS — K449 Diaphragmatic hernia without obstruction or gangrene: Secondary | ICD-10-CM | POA: Diagnosis not present

## 2021-11-19 DIAGNOSIS — R030 Elevated blood-pressure reading, without diagnosis of hypertension: Secondary | ICD-10-CM | POA: Diagnosis not present

## 2021-11-19 DIAGNOSIS — K219 Gastro-esophageal reflux disease without esophagitis: Secondary | ICD-10-CM | POA: Diagnosis not present

## 2022-01-07 DIAGNOSIS — E785 Hyperlipidemia, unspecified: Secondary | ICD-10-CM | POA: Diagnosis not present

## 2022-01-07 DIAGNOSIS — R7309 Other abnormal glucose: Secondary | ICD-10-CM | POA: Diagnosis not present

## 2022-01-07 DIAGNOSIS — Z0000 Encounter for general adult medical examination without abnormal findings: Secondary | ICD-10-CM | POA: Diagnosis not present

## 2022-01-07 DIAGNOSIS — E559 Vitamin D deficiency, unspecified: Secondary | ICD-10-CM | POA: Diagnosis not present

## 2022-01-08 DIAGNOSIS — J069 Acute upper respiratory infection, unspecified: Secondary | ICD-10-CM | POA: Diagnosis not present

## 2022-01-14 DIAGNOSIS — Z13820 Encounter for screening for osteoporosis: Secondary | ICD-10-CM | POA: Diagnosis not present

## 2022-01-14 DIAGNOSIS — Z0000 Encounter for general adult medical examination without abnormal findings: Secondary | ICD-10-CM | POA: Diagnosis not present

## 2022-02-16 ENCOUNTER — Encounter: Payer: Self-pay | Admitting: Registered Nurse

## 2022-04-08 DIAGNOSIS — H40053 Ocular hypertension, bilateral: Secondary | ICD-10-CM | POA: Diagnosis not present

## 2022-04-08 DIAGNOSIS — H5213 Myopia, bilateral: Secondary | ICD-10-CM | POA: Diagnosis not present

## 2022-04-08 DIAGNOSIS — H2513 Age-related nuclear cataract, bilateral: Secondary | ICD-10-CM | POA: Diagnosis not present

## 2022-04-08 DIAGNOSIS — H524 Presbyopia: Secondary | ICD-10-CM | POA: Diagnosis not present

## 2022-06-18 DIAGNOSIS — Z23 Encounter for immunization: Secondary | ICD-10-CM | POA: Diagnosis not present

## 2022-07-08 ENCOUNTER — Encounter: Payer: Self-pay | Admitting: Gastroenterology

## 2022-07-15 DIAGNOSIS — R7309 Other abnormal glucose: Secondary | ICD-10-CM | POA: Diagnosis not present

## 2022-07-15 DIAGNOSIS — E785 Hyperlipidemia, unspecified: Secondary | ICD-10-CM | POA: Diagnosis not present

## 2022-07-28 DIAGNOSIS — E785 Hyperlipidemia, unspecified: Secondary | ICD-10-CM | POA: Diagnosis not present

## 2022-07-28 DIAGNOSIS — K219 Gastro-esophageal reflux disease without esophagitis: Secondary | ICD-10-CM | POA: Diagnosis not present

## 2022-08-10 DIAGNOSIS — Z1231 Encounter for screening mammogram for malignant neoplasm of breast: Secondary | ICD-10-CM | POA: Diagnosis not present

## 2022-08-10 DIAGNOSIS — Z01419 Encounter for gynecological examination (general) (routine) without abnormal findings: Secondary | ICD-10-CM | POA: Diagnosis not present

## 2022-09-21 DIAGNOSIS — H269 Unspecified cataract: Secondary | ICD-10-CM | POA: Diagnosis not present

## 2022-09-21 DIAGNOSIS — Z961 Presence of intraocular lens: Secondary | ICD-10-CM | POA: Diagnosis not present

## 2022-09-21 DIAGNOSIS — H2511 Age-related nuclear cataract, right eye: Secondary | ICD-10-CM | POA: Diagnosis not present

## 2022-10-05 DIAGNOSIS — H2512 Age-related nuclear cataract, left eye: Secondary | ICD-10-CM | POA: Diagnosis not present

## 2022-10-05 DIAGNOSIS — H269 Unspecified cataract: Secondary | ICD-10-CM | POA: Diagnosis not present

## 2022-10-12 DIAGNOSIS — F4320 Adjustment disorder, unspecified: Secondary | ICD-10-CM | POA: Diagnosis not present

## 2022-10-19 DIAGNOSIS — F4320 Adjustment disorder, unspecified: Secondary | ICD-10-CM | POA: Diagnosis not present

## 2023-02-03 ENCOUNTER — Encounter: Payer: Self-pay | Admitting: Gastroenterology

## 2023-03-09 DIAGNOSIS — G629 Polyneuropathy, unspecified: Secondary | ICD-10-CM | POA: Insufficient documentation

## 2023-03-09 DIAGNOSIS — N898 Other specified noninflammatory disorders of vagina: Secondary | ICD-10-CM | POA: Insufficient documentation

## 2023-03-21 ENCOUNTER — Ambulatory Visit (AMBULATORY_SURGERY_CENTER): Payer: Medicare Other

## 2023-03-21 VITALS — Ht 66.0 in | Wt 180.0 lb

## 2023-03-21 DIAGNOSIS — Z1211 Encounter for screening for malignant neoplasm of colon: Secondary | ICD-10-CM

## 2023-03-21 MED ORDER — NA SULFATE-K SULFATE-MG SULF 17.5-3.13-1.6 GM/177ML PO SOLN: 1.0000 | Freq: Once | ORAL | 0 refills | Status: AC

## 2023-03-21 NOTE — Progress Notes (Signed)
No egg or soy allergy known to patient  No issues known to pt with past sedation with any surgeries or procedures - PONV per patient very sensitive to anesthetic  medications "took a week to get over anesthesia fog" after last colon  Patient denies ever being told they had issues or difficulty with intubation  No FH of Malignant Hyperthermia Pt is not on diet pills Pt is not on  home 02  Pt is not on blood thinners  Pt denies issues with constipation  No A fib or A flutter Have any cardiac testing pending--no  LOA: independent  Prep: suprep   Patient's chart reviewed by Cathlyn Parsons CNRA prior to previsit and patient appropriate for the [facility].  Previsit completed and red dot placed by patient's name on their procedure day (on provider's schedule).     PV competed with patient. Prep instructions sent via mychart and home address. Goodrx coupon for [employer] provided to use for price reduction if needed.

## 2023-04-20 ENCOUNTER — Ambulatory Visit: Payer: Medicare Other | Admitting: Gastroenterology

## 2023-04-20 ENCOUNTER — Encounter: Payer: Self-pay | Admitting: Gastroenterology

## 2023-04-20 VITALS — BP 124/70 | HR 67 | Temp 99.1°F | Resp 17 | Ht 66.0 in | Wt 180.0 lb

## 2023-04-20 DIAGNOSIS — Z1211 Encounter for screening for malignant neoplasm of colon: Secondary | ICD-10-CM

## 2023-04-20 DIAGNOSIS — D123 Benign neoplasm of transverse colon: Secondary | ICD-10-CM | POA: Diagnosis not present

## 2023-04-20 MED ORDER — SODIUM CHLORIDE 0.9 % IV SOLN: 500.0000 mL | Freq: Once | INTRAVENOUS | Status: DC

## 2023-04-20 NOTE — Patient Instructions (Signed)
Await pathology results.  Continue present medications.  Resume previous diet.  Handouts on polyps provided.  YOU HAD AN ENDOSCOPIC PROCEDURE TODAY AT THE [facility]:   Refer to the procedure report that was given to you for any specific questions about what was found during the examination.  If the procedure report does not answer your questions, please call your gastroenterologist to clarify.  If you requested that your care partner not be given the details of your procedure findings, then the procedure report has been included in a sealed envelope for you to review at your convenience later.  YOU SHOULD EXPECT: Some feelings of bloating in the abdomen. Passage of more gas than usual.  Walking can help get rid of the air that was put into your GI tract during the procedure and reduce the bloating. If you had a lower endoscopy (such as a colonoscopy or flexible sigmoidoscopy) you may notice spotting of blood in your stool or on the toilet paper. If you underwent a bowel prep for your procedure, you may not have a normal bowel movement for a few days.  Please Note:  You might notice some irritation and congestion in your nose or some drainage.  This is from the oxygen used during your procedure.  There is no need for concern and it should clear up in a day or so.  SYMPTOMS TO REPORT IMMEDIATELY:  Following lower endoscopy (colonoscopy or flexible sigmoidoscopy):  Excessive amounts of blood in the stool  Significant tenderness or worsening of abdominal pains  Swelling of the abdomen that is new, acute  Fever of 100F or higher   For urgent or emergent issues, a gastroenterologist can be reached at any hour by calling [phone]. Do not use MyChart messaging for urgent concerns.    DIET:  We do recommend a Virgin meal at first, but then you may proceed to your regular diet.  Drink plenty of fluids but you should avoid alcoholic beverages for 24 hours.  ACTIVITY:  You  should plan to take it easy for the rest of today and you should NOT DRIVE or use heavy machinery until tomorrow (because of the sedation medicines used during the test).    FOLLOW UP: Our staff will call the number listed on your records the next business day following your procedure.  We will call around 7:15- 8:00 am to check on you and address any questions or concerns that you may have regarding the information given to you following your procedure. If we do not reach you, we will leave a message.     If any biopsies were taken you will be contacted by phone or by letter within the next 1-3 weeks.  Please call us at [phone] if you have not heard about the biopsies in 3 weeks.    SIGNATURES/CONFIDENTIALITY: You and/or your care partner have signed paperwork which will be entered into your electronic medical record.  These signatures attest to the fact that that the information above on your After Visit Summary has been reviewed and is understood.  Full responsibility of the confidentiality of this discharge information lies with you and/or your care-partner.

## 2023-04-20 NOTE — Progress Notes (Signed)
Sedate, gd SR, tolerated procedure well, VSS, report to RN 

## 2023-04-20 NOTE — Progress Notes (Signed)
Pt's states no medical or surgical changes since previsit or office visit. 

## 2023-04-20 NOTE — Progress Notes (Signed)
[facility] History and Physical   Primary Care Physician:  Merri Brunette, MD   Reason for Procedure:  Colorectal cancer screening  Plan:    Screening colonoscopy with possible interventions as needed     HPI: Janice Phillips is a very pleasant 72 y.o. female here for screening colonoscopy. Denies any nausea, vomiting, abdominal pain, melena or bright red blood per rectum  The risks and benefits as well as alternatives of endoscopic procedure(s) have been discussed and reviewed. All questions answered. The patient agrees to proceed.    Past Medical History:  Diagnosis Date   Cancer [facility])    endometrial   Complication of anesthesia    took a week to get over "fog" of anesthesia   Family history of adverse reaction to anesthesia    mother- n&v   Fibromyalgia    GERD (gastroesophageal reflux disease)    no meds currently   Hyperlipidemia    Low tolerance to pain medication    to all meds   PONV (postoperative nausea and vomiting)     Past Surgical History:  Procedure Laterality Date   COLONOSCOPY     x 2   DILATION AND CURETTAGE OF UTERUS  1985   KNEE ARTHROSCOPY  2015   ROBOTIC ASSISTED TOTAL HYSTERECTOMY WITH BILATERAL SALPINGO OOPHERECTOMY Bilateral 02/17/2016   Procedure: XI ROBOTIC ASSISTED TOTAL HYSTERECTOMY WITH BILATERAL SALPINGO OOPHORECTOMY AND SENTAL LYMPH NODE BIOPSY;  Surgeon: Adolphus Birchwood, MD;  Location: [facility];  Service: Gynecology;  Laterality: Bilateral;    Prior to Admission medications   Medication Sig Start Date End Date Taking? Authorizing Provider  CALCIUM & MAGNESIUM CARBONATES PO Take 232-311 mg by mouth daily.   Yes [provider]  cholecalciferol (VITAMIN D) 1000 UNITS tablet Take 1,000 Units by mouth daily.   Yes [provider]  conjugated estrogens (PREMARIN) vaginal cream Place 1 Applicatorful vaginally 3 (three) times a week. 09/26/17  Yes Cross, Melissa D, NP  Cyanocobalamin (VITAMIN B12) 1000 MCG TBCR Take 1  tablet by mouth daily.   Yes [provider]  famotidine (PEPCID) 20 MG tablet Take 20 mg by mouth daily. 11/19/21  Yes [provider]  omeprazole (PRILOSEC) 20 MG capsule Take 20 mg by mouth daily.   Yes [provider]  calcium gluconate 500 MG tablet Take 500 mg by mouth daily.    [provider]  ibuprofen (ADVIL,MOTRIN) 200 MG tablet Take 400 mg by mouth every 6 (six) hours as needed for headache. Reported on 03/15/2016    [provider]  meclizine (ANTIVERT) 25 MG tablet Take 25 mg by mouth as needed (For vertigo.). Reported on 03/15/2016 03/15/12   [provider]  scopolamine (TRANSDERM-SCOP) 1 MG/3DAYS Place 1 patch onto the skin every 3 (three) days. For cruise Patient not taking: Reported on 03/21/2023 01/14/22   [provider]  triamcinolone cream (KENALOG) 0.1 % Apply 1 Application topically as needed. Patient not taking: Reported on 03/21/2023    [provider]    Current Outpatient Medications  Medication Sig Dispense Refill   CALCIUM & MAGNESIUM CARBONATES PO Take 232-311 mg by mouth daily.     cholecalciferol (VITAMIN D) 1000 UNITS tablet Take 1,000 Units by mouth daily.     conjugated estrogens (PREMARIN) vaginal cream Place 1 Applicatorful vaginally 3 (three) times a week. 42.5 g 1   Cyanocobalamin (VITAMIN B12) 1000 MCG TBCR Take 1 tablet by mouth daily.     famotidine (PEPCID) 20 MG tablet Take  20 mg by mouth daily.     omeprazole (PRILOSEC) 20 MG capsule Take 20 mg by mouth daily.     calcium gluconate 500 MG tablet Take 500 mg by mouth daily.     ibuprofen (ADVIL,MOTRIN) 200 MG tablet Take 400 mg by mouth every 6 (six) hours as needed for headache. Reported on 03/15/2016     meclizine (ANTIVERT) 25 MG tablet Take 25 mg by mouth as needed (For vertigo.). Reported on 03/15/2016     scopolamine (TRANSDERM-SCOP) 1 MG/3DAYS Place 1 patch onto the skin every 3 (three) days. For cruise (Patient not taking: Reported  on 03/21/2023)     triamcinolone cream (KENALOG) 0.1 % Apply 1 Application topically as needed. (Patient not taking: Reported on 03/21/2023)     Current Facility-Administered Medications  Medication Dose Route Frequency Provider Last Rate Last Admin   0.9 %  sodium chloride infusion  500 mL Intravenous Once Napoleon Form, MD        Allergies as of 04/20/2023 - Review Complete 04/20/2023  Allergen Reaction Noted   Chlorhexidine Hives 12/19/2020   Atorvastatin Other (See Comments) 12/22/2020   Crestor [rosuvastatin calcium]  03/21/2023   Hydrocodone-acetaminophen Nausea And Vomiting 03/21/2023   Vicodin [hydrocodone-acetaminophen] Nausea And Vomiting 05/10/2012    Family History  Problem Relation Age of Onset   Colon polyps Mother    Congestive Heart Failure Mother    Breast cancer Sister    Colon cancer Maternal Grandfather 88   Stomach cancer Neg Hx    Esophageal cancer Neg Hx    Rectal cancer Neg Hx     Social History   Socioeconomic History   Marital status: Married    Spouse name: Not on file   Number of children: Not on file   Years of education: Not on file   Highest education level: Not on file  Occupational History   Not on file  Tobacco Use   Smoking status: Never   Smokeless tobacco: Never  Vaping Use   Vaping status: Never Used  Substance and Sexual Activity   Alcohol use: No   Drug use: No   Sexual activity: Not on file  Other Topics Concern   Not on file  Social History Narrative   Not on file   Social Determinants of Health   Financial Resource Strain: Not on file  Food Insecurity: Not on file  Transportation Needs: Not on file  Physical Activity: Not on file  Stress: Not on file  Social Connections: Not on file  Intimate Partner Violence: Not on file    Review of Systems:  All other review of systems negative except as mentioned in the HPI.  Physical Exam: Vital signs in last 24 hours: BP (!) 141/91   Pulse 96   Temp 99.1 F (37.3  C) (Temporal)   Ht 5\' 6"  (1.676 m)   Wt 180 lb (81.6 kg)   SpO2 97%   BMI 29.05 kg/m  General:   Alert, NAD Lungs:  Clear .   Heart:  Regular rate and rhythm Abdomen:  Soft, nontender and nondistended. Neuro/Psych:  Alert and cooperative. Normal mood and affect. A and O x 3  Reviewed labs, radiology imaging, old records and pertinent past GI work up  Patient is appropriate for planned procedure(s) and anesthesia in an ambulatory setting   K. Scherry Ran , MD [phone]

## 2023-04-20 NOTE — Op Note (Signed)
[facility] Patient Name: Janice Phillips Procedure Date: 04/20/2023 8:28 AM MRN: [id number] Endoscopist: Napoleon Form , MD, [id number] Age: 72 Referring MD:  Date of Birth: 11/03/1950 Gender: Female Account #: [id number] Procedure:                Colonoscopy Indications:              Screening for colorectal malignant neoplasm Medicines:                Monitored Anesthesia Care Procedure:                Pre-Anesthesia Assessment:                           - Prior to the procedure, a History and Physical                            was performed, and patient medications and                            allergies were reviewed. The patient's tolerance of                            previous anesthesia was also reviewed. The risks                            and benefits of the procedure and the sedation                            options and risks were discussed with the patient.                            All questions were answered, and informed consent                            was obtained. Prior Anticoagulants: The patient has                            taken no anticoagulant or antiplatelet agents. ASA                            Grade Assessment: II - A patient with mild systemic                            disease. After reviewing the risks and benefits,                            the patient was deemed in satisfactory condition to                            undergo the procedure.                           After obtaining informed consent, the colonoscope  was passed under direct vision. Throughout the                            procedure, the patient's blood pressure, pulse, and                            oxygen saturations were monitored continuously. The                            Olympus PCF-H190DL (#[id number]) Colonoscope was                            introduced through the anus and advanced to the the                            cecum,  identified by appendiceal orifice and                            ileocecal valve. The colonoscopy was performed                            without difficulty. The patient tolerated the                            procedure well. The quality of the bowel                            preparation was good. The ileocecal valve,                            appendiceal orifice, and rectum were photographed. Scope In: 8:44:01 AM Scope Out: 8:53:52 AM Scope Withdrawal Time: 0 hours 7 minutes 54 seconds  Total Procedure Duration: 0 hours 9 minutes 51 seconds  Findings:                 The perianal and digital rectal examinations were                            normal.                           A 5 mm polyp was found in the transverse colon. The                            polyp was sessile. The polyp was removed with a                            cold snare. Resection and retrieval were complete.                           Scattered Trettel-mouthed diverticula were found in                            the sigmoid colon and descending colon.  Non-bleeding external and internal hemorrhoids were                            found during retroflexion. The hemorrhoids were                            medium-sized. Complications:            No immediate complications. Estimated Blood Loss:     Estimated blood loss was minimal. Impression:               - One 5 mm polyp in the transverse colon, removed                            with a cold snare. Resected and retrieved.                           - Diverticulosis in the sigmoid colon and in the                            descending colon.                           - Non-bleeding external and internal hemorrhoids. Recommendation:           - Resume previous diet.                           - Continue present medications.                           - Await pathology results.                           - Repeat colonoscopy in 5-10 years for  surveillance                            based on pathology results. Napoleon Form, MD 04/20/2023 9:03:19 AM This report has been signed electronically.

## 2023-04-22 ENCOUNTER — Telehealth: Payer: Self-pay | Admitting: *Deleted

## 2023-04-22 NOTE — Telephone Encounter (Signed)
  Follow up Call-     04/20/2023    7:39 AM  Call back number  Post procedure Call Back phone  # [phone]  Permission to leave phone message Yes     Patient questions:  Do you have a fever, pain , or abdominal swelling? No. Pain Score  0 *  Have you tolerated food without any problems? Yes.    Have you been able to return to your normal activities? Yes.    Do you have any questions about your discharge instructions: Diet   No. Medications  No. Follow up visit  No.  Do you have questions or concerns about your Care? No.  Actions: * If pain score is 4 or above: No action needed, pain <4.

## 2023-05-11 ENCOUNTER — Encounter: Payer: Self-pay | Admitting: Gastroenterology

## 2024-02-06 ENCOUNTER — Other Ambulatory Visit (HOSPITAL_COMMUNITY): Payer: Self-pay | Admitting: Registered Nurse

## 2024-02-06 DIAGNOSIS — E785 Hyperlipidemia, unspecified: Secondary | ICD-10-CM

## 2024-02-21 ENCOUNTER — Other Ambulatory Visit (HOSPITAL_COMMUNITY): Payer: Self-pay

## 2024-02-27 ENCOUNTER — Ambulatory Visit (HOSPITAL_COMMUNITY)
Admission: RE | Admit: 2024-02-27 | Discharge: 2024-02-27 | Disposition: A | Discharge status: Home / Self Care | Payer: Self-pay | Source: Ambulatory Visit | Attending: Registered Nurse | Admitting: Registered Nurse

## 2024-02-27 DIAGNOSIS — E785 Hyperlipidemia, unspecified: Secondary | ICD-10-CM | POA: Insufficient documentation

## 2024-09-21 ENCOUNTER — Emergency Department (HOSPITAL_BASED_OUTPATIENT_CLINIC_OR_DEPARTMENT_OTHER): Admitting: Radiology

## 2024-09-21 ENCOUNTER — Emergency Department (HOSPITAL_BASED_OUTPATIENT_CLINIC_OR_DEPARTMENT_OTHER)
Admission: EM | Admit: 2024-09-21 | Discharge: 2024-09-21 | Disposition: A | Discharge status: Home / Self Care | Attending: Emergency Medicine | Admitting: Emergency Medicine

## 2024-09-21 ENCOUNTER — Encounter (HOSPITAL_BASED_OUTPATIENT_CLINIC_OR_DEPARTMENT_OTHER): Payer: Self-pay | Admitting: Emergency Medicine

## 2024-09-21 DIAGNOSIS — B974 Respiratory syncytial virus as the cause of diseases classified elsewhere: Secondary | ICD-10-CM | POA: Diagnosis not present

## 2024-09-21 DIAGNOSIS — B338 Other specified viral diseases: Secondary | ICD-10-CM | POA: Insufficient documentation

## 2024-09-21 DIAGNOSIS — Z8542 Personal history of malignant neoplasm of other parts of uterus: Secondary | ICD-10-CM | POA: Diagnosis not present

## 2024-09-21 DIAGNOSIS — R062 Wheezing: Secondary | ICD-10-CM | POA: Diagnosis not present

## 2024-09-21 DIAGNOSIS — R059 Cough, unspecified: Secondary | ICD-10-CM | POA: Diagnosis present

## 2024-09-21 LAB — RESP PANEL BY RT-PCR (RSV, FLU A&B, COVID)  RVPGX2
Influenza A by PCR: NEGATIVE
Influenza B by PCR: NEGATIVE
Resp Syncytial Virus by PCR: POSITIVE — AB
SARS Coronavirus 2 by RT PCR: NEGATIVE

## 2024-09-21 MED ORDER — ALBUTEROL SULFATE HFA 108 (90 BASE) MCG/ACT IN AERS
1.0000 | INHALATION_SPRAY | Freq: Four times a day (QID) | RESPIRATORY_TRACT | Status: DC | PRN
  Administered 2024-09-21: 2 via RESPIRATORY_TRACT
  Filled 2024-09-21: qty 6.7

## 2024-09-21 MED ORDER — AEROCHAMBER PLUS FLO-VU MEDIUM MISC
1.0000 | Freq: Once | Status: AC
  Administered 2024-09-21: 1

## 2024-09-21 NOTE — ED Triage Notes (Signed)
 Pt referred by pcp for XR to r/o PNA. Pt endorses cough with congestion x 4 days.

## 2024-09-21 NOTE — ED Provider Notes (Signed)
 " [place] EMERGENCY DEPARTMENT AT [facility] Provider Note   CSN: [id number] Arrival date & time: 09/21/24  1134     Patient presents with: Cough   Janice Phillips is a 74 y.o. female with history of endometrial cancer, presents with concern for a dry cough and congestion for the past 4 days.  Denies any associate shortness of breath.  No sore throat, ear pain, nausea or vomiting.  No chest pain.  Reports she was seen by her PCP for this and was recommended to come to the ER for a chest x-ray to rule out pneumonia.    Cough      Prior to Admission medications  Medication Sig Start Date End Date Taking? Authorizing Provider  CALCIUM & MAGNESIUM CARBONATES PO Take 232-311 mg by mouth daily.    [provider]  calcium gluconate 500 MG tablet Take 500 mg by mouth daily.    [provider]  cholecalciferol (VITAMIN D) 1000 UNITS tablet Take 1,000 Units by mouth daily.    [provider]  conjugated estrogens  (PREMARIN ) vaginal cream Place 1 Applicatorful vaginally 3 (three) times a week. 09/26/17   Cross, Melissa D, NP  Cyanocobalamin  (VITAMIN B12) 1000 MCG TBCR Take 1 tablet by mouth daily.    [provider]  famotidine (PEPCID) 20 MG tablet Take 20 mg by mouth daily. 11/19/21   [provider]  ibuprofen  (ADVIL ,MOTRIN ) 200 MG tablet Take 400 mg by mouth every 6 (six) hours as needed for headache. Reported on 03/15/2016    [provider]  meclizine (ANTIVERT) 25 MG tablet Take 25 mg by mouth as needed (For vertigo.). Reported on 03/15/2016 03/15/12   [provider]  omeprazole (PRILOSEC) 20 MG capsule Take 20 mg by mouth daily.    [provider]  scopolamine (TRANSDERM-SCOP) 1 MG/3DAYS Place 1 patch onto the skin every 3 (three) days. For cruise Patient not taking: Reported on 03/21/2023 01/14/22   [provider]  triamcinolone cream (KENALOG) 0.1 % Apply 1 Application topically as needed. Patient not  taking: Reported on 03/21/2023    [provider]    Allergies: Chlorhexidine , Atorvastatin, Crestor [rosuvastatin calcium], Hydrocodone-acetaminophen , and Vicodin [hydrocodone-acetaminophen ]    Review of Systems  Respiratory:  Positive for cough.     Updated Vital Signs BP (!) 164/98 (BP Location: Right Arm)   Pulse 96   Temp 98.6 F (37 C) (Oral)   Resp 18   Wt 79.8 kg   SpO2 94%   BMI 28.41 kg/m   Physical Exam Vitals and nursing note reviewed.  Constitutional:      General: She is not in acute distress.    Appearance: She is well-developed.  HENT:     Head: Normocephalic and atraumatic.     Mouth/Throat:     Pharynx: No oropharyngeal exudate or posterior oropharyngeal erythema.  Eyes:     Conjunctiva/sclera: Conjunctivae normal.  Cardiovascular:     Rate and Rhythm: Normal rate and regular rhythm.     Heart sounds: No murmur heard. Pulmonary:     Effort: Pulmonary effort is normal. No respiratory distress.     Comments: Mild expiratory wheezing bilaterally Talking in full sentences on room air without difficulty Abdominal:     Palpations: Abdomen is soft.     Tenderness: There is no abdominal tenderness.  Musculoskeletal:        General: No swelling.     Cervical back: Neck supple.  Skin:    General: Skin  is warm and dry.     Capillary Refill: Capillary refill takes less than 2 seconds.  Neurological:     Mental Status: She is alert.  Psychiatric:        Mood and Affect: Mood normal.     (all labs ordered are listed, but only abnormal results are displayed) Labs Reviewed  RESP PANEL BY RT-PCR (RSV, FLU A&B, COVID)  RVPGX2 - Abnormal; Notable for the following components:      Result Value   Resp Syncytial Virus by PCR POSITIVE (*)    All other components within normal limits    EKG: None  Radiology: DG Chest 2 View Result Date: 09/21/2024 EXAM: 2 VIEW(S) XRAY OF THE CHEST 09/21/2024 12:34:41 PM COMPARISON: 02/12/2016. CLINICAL HISTORY:  cough FINDINGS: LUNGS AND PLEURA: No focal pulmonary opacity. No pleural effusion. No pneumothorax. HEART AND MEDIASTINUM: No acute abnormality of the cardiac and mediastinal silhouettes. BONES AND SOFT TISSUES: No acute osseous abnormality. IMPRESSION: 1. No acute cardiopulmonary process. Electronically signed by: Waddell Calk MD 09/21/2024 01:45 PM EST RP Workstation: [id number]     Procedures   Medications Ordered in the ED  albuterol  (VENTOLIN  HFA) 108 (90 Base) MCG/ACT inhaler 1-2 puff (has no administration in time range)  AeroChamber Plus Flo-Vu Medium MISC 1 each (has no administration in time range)                                    Medical Decision Making Amount and/or Complexity of Data Reviewed Radiology: ordered.  Risk Prescription drug management.     Differential diagnosis includes but is not limited to COVID, flu, RSV, viral URI, strep pharyngitis, viral pharyngitis, allergic rhinitis, pneumonia, bronchitis   ED Course:  Upon initial evaluation, patient is well-appearing, no acute distress.  Lungs with mild expiratory wheezing bilaterally.  Patient not reporting any shortness of breath.  Talking in full sentences on room air without difficulty.  Labs Ordered: I Ordered, and personally interpreted labs.  The pertinent results include:   RSV positive.  Flu and COVID-negative  Imaging Studies ordered: I ordered imaging studies including chest x-ray I independently visualized the imaging with scope of interpretation limited to determining acute life threatening conditions related to emergency care. Imaging showed no acute abnormalities I agree with the radiologist interpretation   Medications Given: Albuterol  inhaler  Upon re-evaluation, patient remains well-appearing with stable vitals.  She tested positive for RSV which would explain the cough she has been having for the past couple of days.  Negative for flu and COVID.  Chest x-ray without any signs of  pneumonia.  Patient clinically appears very well.  No shortness of breath or hypoxia.  Mild wheezing bilaterally, will give patient albuterol  inhaler for use at home.  Stable and appropriate for discharge home this time    Impression: RSV  Disposition:  Discharged home with instructions to use over-the-counter medications as needed for symptom control.  Albuterol  inhaler 1 to 2 puffs every 6 hours as needed for shortness of breath or wheezing.  Follow-up with PCP if symptoms not improving within the next 5 days. Return precautions given and patient verbalized understanding.    This chart was dictated using voice recognition software, Dragon. Despite the best efforts of this provider to proofread and correct errors, errors may still occur which can change documentation meaning.       Final diagnoses:  Infection due to respiratory syncytial virus (RSV),  unspecified infection type    ED Discharge Orders     None          Veta Palma, DEVONNA 09/21/24 1703    Patsey Lot, MD 09/24/24 1504  "

## 2024-09-21 NOTE — ED Notes (Signed)
 Pt left before receiving her discharge  papers.

## 2024-09-21 NOTE — Discharge Instructions (Addendum)
 You tested positive for RSV (respiratory syncytial virus) today.  This is a viral infection of the lungs. Typically, symptoms worsen on day 4, and then gradually improve.   Your flu and COVID test was negative today.  Your chest x-ray did not show any signs of pneumonia  There are no medications, such as antibiotics, that will cure your infection.   You have been given an albuterol inhaler to use every 6 hours as needed if you feel short of breath or develop some wheezing as I did hear some wheezing on your exam today.  Home care instructions:  You can take Tylenol  and/or Ibuprofen  as directed on the packaging for fever reduction and pain relief.    For cough: honey 1/2 to 1 teaspoon (you can dilute the honey in water  or another fluid).  You can also use children's guaifenesin and dextromethorphan for cough which are over-the-counter medications. You can use a humidifier for chest congestion and cough.  If you don't have a humidifier, you can sit in the bathroom with the hot shower running.      For sore throat: try warm salt water  gargles, cepacol lozenges, throat spray, warm tea or water  with lemon/honey, popsicles or ice, or OTC cold relief medicine for throat discomfort.    For congestion: Flonase 1-2 sprays in each nostril daily.    It is important to stay hydrated: drink plenty of fluids (water , gatorade/powerade/pedialyte, juices, or teas) to keep your throat moisturized and help further relieve irritation/discomfort.   Your illness is contagious and can be spread to others. It cannot be cured by antibiotics or other medicines. Take basic precautions such as washing your hands often, covering your mouth when you cough or sneeze, and avoiding public places where you could spread your illness to others.   Follow-up instructions: Please follow-up with your primary care provider for further evaluation of your symptoms if you are not improving within the next 5-7 days.  Please discuss  getting the RSV vaccine with your PCP  Your blood pressure was elevated today at 164/98.  Please follow-up with your PCP within next month for further management of your elevated blood pressure.  Return instructions:  Please return to the Emergency Department if you experience worsening symptoms.  RETURN IMMEDIATELY IF you develop shortness of breath, confusion or altered mental status, a new rash, become dizzy, faint, or poorly responsive, or are unable to be cared for at home. Please return if you have persistent vomiting and cannot keep down fluids or develop a fever that is not controlled by tylenol  or motrin .   Please return if you have any other emergent concerns.

## 2024-11-02 ENCOUNTER — Ambulatory Visit: Admitting: Physical Therapy
